# Patient Record
Sex: Female | Born: 1960 | Race: White | Hispanic: No | Marital: Married | Smoking: Never smoker
Health system: Southern US, Community
[De-identification: ages and names within clinical notes are randomized; demographics above are authoritative.]

## PROBLEM LIST (undated history)

## (undated) DIAGNOSIS — Z8632 Personal history of gestational diabetes: Secondary | ICD-10-CM

## (undated) HISTORY — DX: Personal history of gestational diabetes: Z86.32

---

## 1997-09-11 ENCOUNTER — Other Ambulatory Visit: Admission: RE | Admit: 1997-09-11 | Discharge: 1997-09-11 | Payer: Self-pay | Admitting: Obstetrics and Gynecology

## 2000-09-27 ENCOUNTER — Other Ambulatory Visit: Admission: RE | Admit: 2000-09-27 | Discharge: 2000-09-27 | Payer: Self-pay | Admitting: Gynecology

## 2001-08-29 ENCOUNTER — Encounter: Payer: Self-pay | Admitting: Obstetrics and Gynecology

## 2001-08-29 ENCOUNTER — Ambulatory Visit (HOSPITAL_COMMUNITY): Admission: RE | Admit: 2001-08-29 | Discharge: 2001-08-29 | Payer: Self-pay | Admitting: Obstetrics and Gynecology

## 2001-09-27 ENCOUNTER — Other Ambulatory Visit: Admission: RE | Admit: 2001-09-27 | Discharge: 2001-09-27 | Payer: Self-pay | Admitting: Gynecology

## 2002-10-02 ENCOUNTER — Other Ambulatory Visit: Admission: RE | Admit: 2002-10-02 | Discharge: 2002-10-02 | Payer: Self-pay | Admitting: Gynecology

## 2002-10-23 ENCOUNTER — Encounter: Payer: Self-pay | Admitting: Gynecology

## 2002-10-23 ENCOUNTER — Ambulatory Visit (HOSPITAL_COMMUNITY): Admission: RE | Admit: 2002-10-23 | Discharge: 2002-10-23 | Payer: Self-pay | Admitting: Gynecology

## 2003-10-26 ENCOUNTER — Ambulatory Visit (HOSPITAL_COMMUNITY): Admission: RE | Admit: 2003-10-26 | Discharge: 2003-10-26 | Payer: Self-pay | Admitting: Gynecology

## 2004-10-18 ENCOUNTER — Other Ambulatory Visit: Admission: RE | Admit: 2004-10-18 | Discharge: 2004-10-18 | Payer: Self-pay | Admitting: Gynecology

## 2004-10-26 ENCOUNTER — Ambulatory Visit (HOSPITAL_COMMUNITY): Admission: RE | Admit: 2004-10-26 | Discharge: 2004-10-26 | Payer: Self-pay | Admitting: Gynecology

## 2005-10-25 ENCOUNTER — Other Ambulatory Visit: Admission: RE | Admit: 2005-10-25 | Discharge: 2005-10-25 | Payer: Self-pay | Admitting: Gynecology

## 2005-10-27 ENCOUNTER — Ambulatory Visit (HOSPITAL_COMMUNITY): Admission: RE | Admit: 2005-10-27 | Discharge: 2005-10-27 | Payer: Self-pay | Admitting: Gynecology

## 2006-10-29 ENCOUNTER — Ambulatory Visit (HOSPITAL_COMMUNITY): Admission: RE | Admit: 2006-10-29 | Discharge: 2006-10-29 | Payer: Self-pay | Admitting: Gynecology

## 2006-10-29 ENCOUNTER — Other Ambulatory Visit: Admission: RE | Admit: 2006-10-29 | Discharge: 2006-10-29 | Payer: Self-pay | Admitting: Gynecology

## 2006-11-15 ENCOUNTER — Encounter: Admission: RE | Admit: 2006-11-15 | Discharge: 2006-11-15 | Payer: Self-pay | Admitting: Gynecology

## 2007-11-05 ENCOUNTER — Other Ambulatory Visit: Admission: RE | Admit: 2007-11-05 | Discharge: 2007-11-05 | Payer: Self-pay | Admitting: Gynecology

## 2007-11-05 ENCOUNTER — Ambulatory Visit (HOSPITAL_COMMUNITY): Admission: RE | Admit: 2007-11-05 | Discharge: 2007-11-05 | Payer: Self-pay | Admitting: Gynecology

## 2008-11-09 ENCOUNTER — Encounter: Payer: Self-pay | Admitting: Women's Health

## 2008-11-09 ENCOUNTER — Other Ambulatory Visit: Admission: RE | Admit: 2008-11-09 | Discharge: 2008-11-09 | Payer: Self-pay | Admitting: Gynecology

## 2008-11-09 ENCOUNTER — Ambulatory Visit (HOSPITAL_COMMUNITY): Admission: RE | Admit: 2008-11-09 | Discharge: 2008-11-09 | Payer: Self-pay | Admitting: Gynecology

## 2008-11-09 ENCOUNTER — Ambulatory Visit: Payer: Self-pay | Admitting: Women's Health

## 2009-11-10 ENCOUNTER — Other Ambulatory Visit: Admission: RE | Admit: 2009-11-10 | Discharge: 2009-11-10 | Payer: Self-pay | Admitting: Gynecology

## 2009-11-10 ENCOUNTER — Ambulatory Visit (HOSPITAL_COMMUNITY): Admission: RE | Admit: 2009-11-10 | Discharge: 2009-11-10 | Payer: Self-pay | Admitting: Gynecology

## 2009-11-10 ENCOUNTER — Ambulatory Visit: Payer: Self-pay | Admitting: Women's Health

## 2010-10-21 ENCOUNTER — Other Ambulatory Visit: Payer: Self-pay | Admitting: Women's Health

## 2010-10-21 DIAGNOSIS — Z1231 Encounter for screening mammogram for malignant neoplasm of breast: Secondary | ICD-10-CM

## 2010-11-14 ENCOUNTER — Ambulatory Visit (HOSPITAL_COMMUNITY): Payer: Self-pay

## 2010-11-14 ENCOUNTER — Encounter: Payer: Self-pay | Admitting: Women's Health

## 2010-11-18 ENCOUNTER — Ambulatory Visit (INDEPENDENT_AMBULATORY_CARE_PROVIDER_SITE_OTHER): Payer: BC Managed Care – PPO | Admitting: Women's Health

## 2010-11-18 ENCOUNTER — Encounter: Payer: Self-pay | Admitting: Women's Health

## 2010-11-18 ENCOUNTER — Other Ambulatory Visit (HOSPITAL_COMMUNITY)
Admission: RE | Admit: 2010-11-18 | Discharge: 2010-11-18 | Disposition: A | Discharge status: Home / Self Care | Payer: BC Managed Care – PPO | Source: Ambulatory Visit | Attending: Women's Health | Admitting: Women's Health

## 2010-11-18 ENCOUNTER — Ambulatory Visit (HOSPITAL_COMMUNITY)
Admission: RE | Admit: 2010-11-18 | Discharge: 2010-11-18 | Disposition: A | Discharge status: Home / Self Care | Payer: BC Managed Care – PPO | Source: Ambulatory Visit | Attending: Women's Health | Admitting: Women's Health

## 2010-11-18 VITALS — BP 120/76 | Ht 68.5 in | Wt 192.8 lb

## 2010-11-18 DIAGNOSIS — Z1231 Encounter for screening mammogram for malignant neoplasm of breast: Secondary | ICD-10-CM

## 2010-11-18 DIAGNOSIS — Z01419 Encounter for gynecological examination (general) (routine) without abnormal findings: Secondary | ICD-10-CM | POA: Insufficient documentation

## 2010-11-18 DIAGNOSIS — Z833 Family history of diabetes mellitus: Secondary | ICD-10-CM

## 2010-11-18 DIAGNOSIS — Z13220 Encounter for screening for lipoid disorders: Secondary | ICD-10-CM

## 2010-11-18 DIAGNOSIS — Z8632 Personal history of gestational diabetes: Secondary | ICD-10-CM | POA: Insufficient documentation

## 2010-11-18 DIAGNOSIS — R52 Pain, unspecified: Secondary | ICD-10-CM

## 2010-11-18 DIAGNOSIS — Z309 Encounter for contraceptive management, unspecified: Secondary | ICD-10-CM

## 2010-11-18 DIAGNOSIS — IMO0001 Reserved for inherently not codable concepts without codable children: Secondary | ICD-10-CM

## 2010-11-18 DIAGNOSIS — R82998 Other abnormal findings in urine: Secondary | ICD-10-CM

## 2010-11-18 MED ORDER — LEVONORGESTREL-ETHINYL ESTRAD 0.1-20 MG-MCG PO TABS: 1.0000 | ORAL_TABLET | Freq: Every day | ORAL | Status: DC

## 2010-11-18 MED ORDER — IBUPROFEN 600 MG PO TABS: 600.0000 mg | ORAL_TABLET | Freq: Three times a day (TID) | ORAL | Status: AC | PRN

## 2010-11-18 NOTE — Progress Notes (Signed)
Brittany Flowers May 05, 1960 [id number]    History:    The patient presents for annual exam. Teacher assistant for third-grade.    Past medical history, past surgical history, family history and social history were all reviewed and documented in the EPIC chart.   ROS:  A  ROS was performed and pertinent positives and negatives are included in the history.  Exam:  Filed Vitals:   11/18/10 0801  BP: 120/76    General appearance:  Normal Head/Neck:  Normal, without cervical or supraclavicular adenopathy. Thyroid:  Symmetrical, normal in size, without palpable masses or nodularity. Respiratory  Effort:  Normal  Auscultation:  Clear without wheezing or rhonchi Cardiovascular  Auscultation:  Regular rate, without rubs, murmurs or gallops  Edema/varicosities:  Not grossly evident Abdominal   Soft,nontender, without masses, guarding or rebound.  Liver/spleen:  No organomegaly noted  Hernia:  None appreciated  Occult test:   Skin  Inspection:  Grossly normal  Palpation:  Grossly normal Neurologic/psychiatric  Orientation:  Normal with appropriate conversation.  Mood/affect:  Normal  Genitourinary    Breasts: Examined lying and sitting.     Right: Without masses, retractions, discharge or axillary adenopathy.     Left: Without masses, retractions, discharge or axillary adenopathy.   Inguinal/mons:  Normal without inguinal adenopathy  External genitalia:  Normal  BUS/Urethra/Skene's glands:  Normal  Bladder:  Normal  Vagina:  Normal  Cervix:  Normal  Uterus:  anteverted, normal in size, shape and contour.  Midline and mobile  Adnexa/parametria:     Rt: Without masses or tenderness.   Lt: Without masses or tenderness.  Anus and perineum: Normal  Digital rectal exam: Normal sphincter tone without palpated masses or tenderness  Assessment/Plan:  50 y.o. MWF G1P1 for annual exam.   Currently on the Aviane for contraception, light monthly cycle. States her cycles are much  lighter, has an occasional hot flash. Prescription proper use was given did review slight risks of blood clots and strokes. SBEs, yearly mammogram, which have been normal. Reviewed importance of a screening colonoscopy, she will schedule at [facility] were her husband had one done. Encouraged regular exercise, calcium rich diet, cutting calories for weight loss. Encouraged vaginal lubricants for occasional vaginal dryness. CBC, glucose, lipid profile, UA and Pap Also has occasional right elbow pain, states no numbness or tingling, will try Motrin 600 every 8 hours as needed, did review if it would continuetol persist to see an orthopedic for evaluation.   [name] [facility], 8:37 AM 11/18/2010

## 2010-11-21 ENCOUNTER — Telehealth: Payer: Self-pay | Admitting: Women's Health

## 2010-11-21 NOTE — Telephone Encounter (Deleted)
Telephone call to review labs. Reviewed glucose was 108 which is slightly elevated but it was not fasting. H&H was normal 13/40. Lipid profile

## 2010-11-21 NOTE — Telephone Encounter (Signed)
Telephone call to patient to review labs. Reviewed glucose was 108 it was not fasting. H&H normal at 13/40. Lipid profile was elevated, with cholesterol 226 HDL 56 triglycerides 128 LDH 144. Reviewed importance of increasing fiber to greater than 20 g per day, decreasing saturated fat less than 20 g per day, fish oil supplement daily and reviewed importance of exercise at least 30 minutesof the week.

## 2010-11-23 ENCOUNTER — Other Ambulatory Visit: Payer: Self-pay | Admitting: Women's Health

## 2011-09-27 ENCOUNTER — Other Ambulatory Visit: Payer: Self-pay | Admitting: Women's Health

## 2011-09-27 DIAGNOSIS — Z1231 Encounter for screening mammogram for malignant neoplasm of breast: Secondary | ICD-10-CM

## 2011-11-13 ENCOUNTER — Other Ambulatory Visit: Payer: Self-pay | Admitting: Women's Health

## 2011-11-20 ENCOUNTER — Encounter: Payer: BC Managed Care – PPO | Admitting: Women's Health

## 2011-11-23 ENCOUNTER — Encounter: Payer: BC Managed Care – PPO | Admitting: Women's Health

## 2011-11-23 ENCOUNTER — Ambulatory Visit (HOSPITAL_COMMUNITY): Payer: BC Managed Care – PPO

## 2011-12-20 ENCOUNTER — Other Ambulatory Visit: Payer: Self-pay | Admitting: Women's Health

## 2011-12-20 NOTE — Telephone Encounter (Signed)
Has CE scheduled for 02/12/12 with [place].

## 2012-01-16 ENCOUNTER — Other Ambulatory Visit: Payer: Self-pay | Admitting: Women's Health

## 2012-02-12 ENCOUNTER — Ambulatory Visit (HOSPITAL_COMMUNITY)
Admission: RE | Admit: 2012-02-12 | Discharge: 2012-02-12 | Disposition: A | Discharge status: Home / Self Care | Payer: BC Managed Care – PPO | Source: Ambulatory Visit | Attending: Women's Health | Admitting: Women's Health

## 2012-02-12 ENCOUNTER — Ambulatory Visit (INDEPENDENT_AMBULATORY_CARE_PROVIDER_SITE_OTHER): Payer: BC Managed Care – PPO | Admitting: Women's Health

## 2012-02-12 ENCOUNTER — Other Ambulatory Visit: Payer: Self-pay | Admitting: Women's Health

## 2012-02-12 ENCOUNTER — Encounter: Payer: Self-pay | Admitting: Women's Health

## 2012-02-12 VITALS — BP 132/84 | Ht 69.5 in | Wt 185.0 lb

## 2012-02-12 DIAGNOSIS — N951 Menopausal and female climacteric states: Secondary | ICD-10-CM

## 2012-02-12 DIAGNOSIS — Z1231 Encounter for screening mammogram for malignant neoplasm of breast: Secondary | ICD-10-CM | POA: Insufficient documentation

## 2012-02-12 DIAGNOSIS — Z01419 Encounter for gynecological examination (general) (routine) without abnormal findings: Secondary | ICD-10-CM

## 2012-02-12 DIAGNOSIS — E079 Disorder of thyroid, unspecified: Secondary | ICD-10-CM

## 2012-02-12 LAB — TSH: TSH: 0.034 u[IU]/mL — ABNORMAL LOW (ref 0.350–4.500)

## 2012-02-12 MED ORDER — LEVONORGESTREL-ETHINYL ESTRAD 0.1-20 MG-MCG PO TABS: 1.0000 | ORAL_TABLET | Freq: Every day | ORAL | Status: DC

## 2012-02-12 NOTE — Patient Instructions (Addendum)
Schedule colonoscopy Vit D 2000 daily   Health Recommendations for Postmenopausal Women Based on the Results of the Women's Health Initiative [facility]) and Other Studies The WHI is a major 15-year research program to address the most common causes of death, disability and poor quality of life in postmenopausal women. Some of these causes are heart disease, cancer, bone loss (osteoporosis) and others. Taking into account all of the findings from [facility] and other studies, here are bottom-line health recommendations for women: CARDIOVASCULAR DISEASE Heart Disease: A heart attack is a medical emergency. Know the signs and symptoms of a heart attack. Hormone therapy should not be used to prevent heart disease. In women with heart disease, hormone therapy should not be used to prevent further disease. Hormone therapy increases the risk of blood clots. Below are things women can do to reduce their risk for heart disease.   Do not smoke. If you smoke, quit. Women who smoke are 2 to 6 times more likely to suffer a heart attack than non-smoking women.  Aim for a healthy weight. Being overweight causes many preventable deaths. Eat a healthy and balanced diet and drink an adequate amount of liquids.  Get moving. Make a commitment to be more physically active. Aim for 30 minutes of activity on most, if not all days of the week.  Eat for heart health. Choose a diet that is low in saturated fat, trans fat, and cholesterol. Include whole grains, vegetables, and fruits. Read the labels on the food container before buying it.  Know your numbers. Ask your caregiver to check your blood pressure, cholesterol (total, HDL, LDL, triglycerides) and blood glucose. Work with your caregiver to improve any numbers that are not normal.  High blood pressure. Limit or stop your table salt intake (try salt substitute and food seasonings), avoid salty foods and drinks. Read the labels on the food container before buying it. Avoid  becoming overweight by eating well and exercising. STROKE  Stroke is a medical emergency. Stroke can be the result of a blood clot in the blood vessel in the brain or by a brain hemorrhage (bleeding). Know the signs and symptoms of a stroke. To lower the risk of developing a stroke:  Avoid fatty foods.  Quit smoking.  Control your diabetes, blood pressure, and irregular heart rate. THROMBOPHLIBITIS (BLOOD CLOT) OF THE LEG  Hormone treatment is a big cause of developing blood clots in the leg. Becoming overweight and leading a stationary lifestyle also may contribute to developing blood clots. Controlling your diet and exercising will help lower the risk of developing blood clots. CANCER SCREENING  Breast Cancer: Women should take steps to reduce their risk of breast cancer. This includes having regular mammograms, monthly self breast exams and regular breast exams by your caregiver. Have a mammogram every one to two years if you are 84 to 51 years old. Have a mammogram annually if you are 30 years old or older depending on your risk factors. Women who are high risk for breast cancer may need more frequent mammograms. There are tests available (testing the genes in your body) if you have family history of breast cancer called BRCA 1 and 2. These tests can help determine the risks of developing breast cancer.  Intestinal or Stomach Cancer: Women should talk to their caregiver about when to start screening, what tests and how often they should be done, and the benefits and risks of doing these tests. Tests to consider are a rectal exam, fecal occult blood,  sigmoidoscopy, colononoscoby, barium enema and upper GI series of the stomach. Depending on the age, you may want to get a medical and family history of colon cancer. Women who are high risk may need to be screened at an earlier age and more often.  Cervical Cancer: A Pap test of the cervix should be done every year and every 3 years when there has  been three straight years of a normal Pap test. Women with an abnormal Pap test should be screened more often or have a cervical biopsy depending on your caregiver's recommendation.  Uterine Cancer: If you have vaginal bleeding after you are in the menopause, it should be evaluated by your caregiver.  Ovarian cancer: There are no reliable tests available to screen for ovarian cancer at this time except for yearly pelvic exams.  Lung Cancer: Yearly chest X-rays can detect lung cancer and should be done on high risk women, such as cigarette smokers and women with chronic lung disease (emphysemia).  Skin Cancer: A complete body skin exam should be done at your yearly examination. Avoid overexposure to the sun and ultraviolet light lamps. Use a strong sun block cream when in the sun. All of these things are important in lowering the risk of skin cancer. MENOPAUSE Menopause Symptoms: Hormone therapy products are effective for treating symptoms associated with menopause:  Moderate to severe hot flashes.  Night sweats.  Mood swings.  Headaches.  Tiredness.  Loss of sex drive.  Insomnia.  Other symptoms. However, hormone therapy products carry serious risks, especially in older women. Women who use or are thinking about using estrogen or estrogen with progestin treatments should discuss that with their caregiver. Your caregiver will know if the benefits outweigh the risks. The Food and Drug Administration (FDA) has concluded that hormone therapy should be used only at the lowest doses and for the shortest amount of time to reach treatment goals. It is not known at what doses there may be less risk of serious side effects. There are other treatments such as herbal medication (not controlled or regulated by the FDA), group therapy, counseling and acupuncture that may be helpful. OSTEOPOROSIS Protecting Against Bone Loss and Preventing Fracture: If hormone therapy is used for prevention of bone  loss (osteoporosis), the risks for bone loss must outweigh the risk of the therapy. Women considering taking hormone therapy for bone loss should ask their health care providers about other medications (fosamax and boniva) that are considered safe and effective for preventing bone loss and bone fractures. To guard against bone loss or fractures, it is recommended that women should take at least 1000-1500 mg of calcium and 400-800 IU of vitamin D daily in divided doses. Smoking and excessive alcohol intake increases the risk of osteoporosis. Eat foods rich in calcium and vitamin D and do weight bearing exercises several times a week as your caregiver suggests. DIABETES Diabetes Melitus: Women with Type I or Type 2 diabetes should keep their diabetes in control with diet, exercise and medication. Avoid too many sweets, starchy and fatty foods. Being overweight can affect your diabetes. COGNITION AND MEMORY Cognition and Memory: Menopausal hormone therapy is not recommended for the prevention of cognitive disorders such as Alzheimer's disease or memory loss. WHI found that women treated with hormone therapy have a greater risk of developing dementia.  DEPRESSION  Depression may occur at any age, but is common in elderly women. The reasons may be because of physical, medical, social (loneliness), financial and/or economic problems and needs.  Becoming involved with church, volunteer or social groups, seeking treatment for any physical or medical problems is recommended. Also, look into getting professional advice for any economic or financial problems. ACCIDENTS  Accidents are common and can be serious in the elderly woman. Prepare your house to prevent accidents. Eliminate throw rugs, use hip protectors, place hand bars in the bath, shower and toilet areas. Avoid wearing high heel shoes and walking on wet, snowy and icy areas. Stop driving if you have vision, hearing problems or are unsteady with you movements  and reflexes. RHEUMATOID ARTHRITIS Rheumatoid arthritis causes pain, swelling and stiffness of your bone joints. It can limit many of your activities. Over-the-counter medications may help, but prescription medications may be necessary. Talk with your caregiver about this. Exercise (walking, water aerobics), good posture, using splints on painful joints, warm baths or applying warm compresses to stiff joints and cold compresses to painful joints may be helpful. Smoking and excessive drinking may worsen the symptoms of arthritis. Seek help from a physical therapist if the arthritis is becoming a problem with your daily activities. IMMUNIZATIONS  Several immunizations are important to have during your senior years, including:   Tetanus and a diptheria shot booster every 10 years.  Influenza every year before the flu season begins.  Pneumonia vaccine.  Shingles vaccine.  Others as indicated (example: H1N1 vaccine). Document Released: 05/12/2005 Document Revised: 06/12/2011 Document Reviewed: 01/06/2008 [facility] Patient Information 2013 [place], [place].

## 2012-02-12 NOTE — Progress Notes (Addendum)
Brittany Flowers 1960/05/15 [id number]    History:    The patient presents for annual exam. Light monthly cycle on Aviane with occasional hot flushes and vaginal dryness. History of normal Paps and mammograms. Has not had a colonoscopy. Has not been feeling well, saw primary care 2 weeks ago, had a normal CBC, glucose, lipid panel, but states TSH was elevated and was to have repeated.  Past medical history, past surgical history, family history and social history were all reviewed and documented in the EPIC chart. Kristen 17 at [employer] and doing well. Second-grade Geologist, engineering. History of GDM, father diabetic.  ROS:  A  ROS was performed and pertinent positives and negatives are included in the history.  Exam:  Filed Vitals:   02/12/12 0831  BP: 132/84    General appearance:  Normal Head/Neck:  Normal, without cervical or supraclavicular adenopathy. Thyroid:  Symmetrical, normal in size, without palpable masses or nodularity. Respiratory  Effort:  Normal  Auscultation:  Clear without wheezing or rhonchi Cardiovascular  Auscultation:  Regular rate, without rubs, murmurs or gallops  Edema/varicosities:  Not grossly evident Abdominal  Soft,nontender, without masses, guarding or rebound.  Liver/spleen:  No organomegaly noted  Hernia:  None appreciated  Skin  Inspection:  Grossly normal  Palpation:  Grossly normal Neurologic/psychiatric  Orientation:  Normal with appropriate conversation.  Mood/affect:  Normal  Genitourinary    Breasts: Examined lying and sitting.     Right: Without masses, retractions, discharge or axillary adenopathy.     Left: Without masses, retractions, discharge or axillary adenopathy.   Inguinal/mons:  Normal without inguinal adenopathy  External genitalia:  Normal  BUS/Urethra/Skene's glands:  Normal  Bladder:  Normal  Vagina:  Vaginal dryness   Cervix:  Normal  Uterus:   normal in size, shape and contour.  Midline and mobile  Adnexa/parametria:      Rt: Without masses or tenderness.   Lt: Without masses or tenderness.  Anus and perineum: Normal  Digital rectal exam: Normal sphincter tone without palpated masses or tenderness  Assessment/Plan:  51 y.o. M. WF G1 P1 for annual exam.     Normal peri- menopausal exam on Aviane  Plan: Aviane prescription, proper use, reviewed slight risk for blood clots and strokes. TSH, FSH, UA. Normal Pap 2012 reviewed new screening guidelines. Reviewed if [facility] is elevated, currently on placebo week will change to Femhrt. SBE's, continue annual mammogram, calcium rich diet, vitamin D 2000 daily encouraged and continue lubricants with intercourse for dryness.. Reviewed importance of decreasing calories and increasing exercise for weight loss and health. Instructed to schedule colonoscopy, husband has had one will schedule there. Instructed to call if problems securing appointment.      [name] [facility], 11:52 AM 02/12/2012

## 2012-02-13 LAB — URINALYSIS W MICROSCOPIC + REFLEX CULTURE
Crystals: NONE SEEN
Glucose, UA: NEGATIVE mg/dL
Hgb urine dipstick: NEGATIVE
Specific Gravity, Urine: 1.013 (ref 1.005–1.030)
Urobilinogen, UA: 0.2 mg/dL (ref 0.0–1.0)
pH: 6 (ref 5.0–8.0)

## 2012-02-14 LAB — THYROID PROFILE - CHCC: T4, Total: 11.8 ug/dL (ref 5.0–12.5)

## 2012-02-14 LAB — URINE CULTURE: Organism ID, Bacteria: NO GROWTH

## 2012-03-10 ENCOUNTER — Other Ambulatory Visit: Payer: Self-pay | Admitting: Women's Health

## 2012-09-03 ENCOUNTER — Telehealth: Payer: Self-pay | Admitting: *Deleted

## 2012-09-06 ENCOUNTER — Encounter: Payer: Self-pay | Admitting: Gastroenterology

## 2012-09-06 ENCOUNTER — Telehealth: Payer: Self-pay

## 2012-09-06 NOTE — Telephone Encounter (Signed)
Patient wants screening colonoscopy scheduled per [place] recommendation for week of July 7. She would like it with [facility]. I called and scheduled her for Thursday, July 10 with Dr. Melvia Heaps at [facility].  She will check in 10:30 for 11:30 appt.  Her nurse visit was scheduled for June 26 4:00pm.  Per patient request I called and gave all this info to her husband.

## 2012-09-19 ENCOUNTER — Ambulatory Visit (AMBULATORY_SURGERY_CENTER): Payer: BC Managed Care – PPO | Admitting: *Deleted

## 2012-09-19 VITALS — Ht 68.0 in | Wt 189.2 lb

## 2012-09-19 DIAGNOSIS — Z1211 Encounter for screening for malignant neoplasm of colon: Secondary | ICD-10-CM

## 2012-09-19 MED ORDER — NA SULFATE-K SULFATE-MG SULF 17.5-3.13-1.6 GM/177ML PO SOLN: 1.0000 | Freq: Once | ORAL | Status: DC

## 2012-09-19 NOTE — Progress Notes (Signed)
Denies allergies to eggs or soy products. Denies complications with sedation or anesthesia. 

## 2012-09-20 ENCOUNTER — Encounter: Payer: Self-pay | Admitting: Gastroenterology

## 2012-10-10 ENCOUNTER — Ambulatory Visit (AMBULATORY_SURGERY_CENTER): Payer: BC Managed Care – PPO | Admitting: Gastroenterology

## 2012-10-10 ENCOUNTER — Encounter: Payer: Self-pay | Admitting: Gastroenterology

## 2012-10-10 VITALS — BP 127/72 | HR 52 | Temp 97.0°F | Resp 15 | Ht 68.0 in | Wt 189.0 lb

## 2012-10-10 DIAGNOSIS — Z1211 Encounter for screening for malignant neoplasm of colon: Secondary | ICD-10-CM

## 2012-10-10 MED ORDER — SODIUM CHLORIDE 0.9 % IV SOLN: 500.0000 mL | INTRAVENOUS | Status: DC

## 2012-10-10 NOTE — Progress Notes (Signed)
Patient did not experience any of the following events: a burn prior to discharge; a fall within the facility; wrong site/side/patient/procedure/implant event; or a hospital transfer or hospital admission upon discharge from the facility. (G8907) Patient did not have preoperative order for IV antibiotic SSI prophylaxis. (G8918)  

## 2012-10-10 NOTE — Progress Notes (Signed)
Stable to RR 

## 2012-10-10 NOTE — Op Note (Signed)
[facility] [address] [place], [address]   COLONOSCOPY PROCEDURE REPORT  PATIENT: Brittany Flowers, Brittany Flowers  MR#: [id number] BIRTHDATE: 1960/08/24 , 51  yrs. old GENDER: Female ENDOSCOPIST: Louis Meckel, MD REFERRED GN:FAOZHY Christell Constant, M.D.  Reynaldo Minium, M.D. PROCEDURE DATE:  10/10/2012 PROCEDURE:   Colonoscopy, diagnostic ASA CLASS:   Class I INDICATIONS:average risk screening. MEDICATIONS: propofol (Diprivan) 200mg  IV  DESCRIPTION OF PROCEDURE:   After the risks benefits and alternatives of the procedure were thoroughly explained, informed consent was obtained.  A digital rectal exam revealed no abnormalities of the rectum.   The LB [id number]  endoscope was introduced through the anus and advanced to the cecum, which was identified by both the appendix and ileocecal valve. No adverse events experienced.   The quality of the prep was excellent using Suprep  The instrument was then slowly withdrawn as the colon was fully examined.      COLON FINDINGS: A normal appearing cecum, ileocecal valve, and appendiceal orifice were identified.  The ascending, hepatic flexure, transverse, splenic flexure, descending, sigmoid colon and rectum appeared unremarkable.  No polyps or cancers were seen. Retroflexed views revealed no abnormalities. The time to cecum=6 minutes 33 seconds.  Withdrawal time=6 minutes 18 seconds.  The scope was withdrawn and the procedure completed. COMPLICATIONS: There were no complications.  ENDOSCOPIC IMPRESSION: Normal colon  RECOMMENDATIONS: Continue current colorectal screening recommendations for "routine risk" patients with a repeat colonoscopy in 10 years.   eSigned:  Louis Meckel, MD 10/10/2012 11:50 AM   cc:

## 2012-10-10 NOTE — Progress Notes (Signed)
No egg or soy products allergy. ewm 

## 2012-10-10 NOTE — Patient Instructions (Addendum)
Discharge instructions given with verbal understanding. Normal exam. Resume previous medications. YOU HAD AN ENDOSCOPIC PROCEDURE TODAY AT THE [facility]: Refer to the procedure report that was given to you for any specific questions about what was found during the examination.  If the procedure report does not answer your questions, please call your gastroenterologist to clarify.  If you requested that your care partner not be given the details of your procedure findings, then the procedure report has been included in a sealed envelope for you to review at your convenience later.  YOU SHOULD EXPECT: Some feelings of bloating in the abdomen. Passage of more gas than usual.  Walking can help get rid of the air that was put into your GI tract during the procedure and reduce the bloating. If you had a lower endoscopy (such as a colonoscopy or flexible sigmoidoscopy) you may notice spotting of blood in your stool or on the toilet paper. If you underwent a bowel prep for your procedure, then you may not have a normal bowel movement for a few days.  DIET: Your first meal following the procedure should be a light meal and then it is ok to progress to your normal diet.  A half-sandwich or bowl of soup is an example of a good first meal.  Heavy or fried foods are harder to digest and may make you feel nauseous or bloated.  Likewise meals heavy in dairy and vegetables can cause extra gas to form and this can also increase the bloating.  Drink plenty of fluids but you should avoid alcoholic beverages for 24 hours.  ACTIVITY: Your care partner should take you home directly after the procedure.  You should plan to take it easy, moving slowly for the rest of the day.  You can resume normal activity the day after the procedure however you should NOT DRIVE or use heavy machinery for 24 hours (because of the sedation medicines used during the test).    SYMPTOMS TO REPORT IMMEDIATELY: A gastroenterologist  can be reached at any hour.  During normal business hours, 8:30 AM to 5:00 PM Monday through Friday, call [phone].  After hours and on weekends, please call the GI answering service at [phone] who will take a message and have the physician on call contact you.   Following lower endoscopy (colonoscopy or flexible sigmoidoscopy):  Excessive amounts of blood in the stool  Significant tenderness or worsening of abdominal pains  Swelling of the abdomen that is new, acute  Fever of 100F or higher  FOLLOW UP: If any biopsies were taken you will be contacted by phone or by letter within the next 1-3 weeks.  Call your gastroenterologist if you have not heard about the biopsies in 3 weeks.  Our staff will call the home number listed on your records the next business day following your procedure to check on you and address any questions or concerns that you may have at that time regarding the information given to you following your procedure. This is a courtesy call and so if there is no answer at the home number and we have not heard from you through the emergency physician on call, we will assume that you have returned to your regular daily activities without incident.  SIGNATURES/CONFIDENTIALITY: You and/or your care partner have signed paperwork which will be entered into your electronic medical record.  These signatures attest to the fact that that the information above on your After Visit Summary has been reviewed   and is understood.  Full responsibility of the confidentiality of this discharge information lies with you and/or your care-partner. 

## 2012-10-11 ENCOUNTER — Telehealth: Payer: Self-pay | Admitting: *Deleted

## 2012-10-11 NOTE — Telephone Encounter (Signed)
No answer, message left for the patient. 

## 2012-10-30 NOTE — Telephone Encounter (Signed)
x

## 2013-01-08 ENCOUNTER — Other Ambulatory Visit: Payer: Self-pay | Admitting: Women's Health

## 2013-01-08 DIAGNOSIS — Z1231 Encounter for screening mammogram for malignant neoplasm of breast: Secondary | ICD-10-CM

## 2013-02-25 ENCOUNTER — Encounter: Payer: Self-pay | Admitting: Women's Health

## 2013-02-25 ENCOUNTER — Ambulatory Visit (HOSPITAL_COMMUNITY): Payer: BC Managed Care – PPO

## 2013-03-31 ENCOUNTER — Ambulatory Visit (HOSPITAL_COMMUNITY)
Admission: RE | Admit: 2013-03-31 | Discharge: 2013-03-31 | Disposition: A | Discharge status: Home / Self Care | Payer: BC Managed Care – PPO | Source: Ambulatory Visit | Attending: Women's Health | Admitting: Women's Health

## 2013-03-31 ENCOUNTER — Encounter: Payer: Self-pay | Admitting: Women's Health

## 2013-03-31 ENCOUNTER — Other Ambulatory Visit (HOSPITAL_COMMUNITY)
Admission: RE | Admit: 2013-03-31 | Discharge: 2013-03-31 | Disposition: A | Discharge status: Home / Self Care | Payer: BC Managed Care – PPO | Source: Ambulatory Visit | Attending: Gynecology | Admitting: Gynecology

## 2013-03-31 ENCOUNTER — Ambulatory Visit (INDEPENDENT_AMBULATORY_CARE_PROVIDER_SITE_OTHER): Payer: BC Managed Care – PPO | Admitting: Women's Health

## 2013-03-31 VITALS — BP 116/76 | Ht 68.5 in | Wt 197.2 lb

## 2013-03-31 DIAGNOSIS — Z01419 Encounter for gynecological examination (general) (routine) without abnormal findings: Secondary | ICD-10-CM

## 2013-03-31 DIAGNOSIS — N951 Menopausal and female climacteric states: Secondary | ICD-10-CM

## 2013-03-31 DIAGNOSIS — IMO0001 Reserved for inherently not codable concepts without codable children: Secondary | ICD-10-CM

## 2013-03-31 DIAGNOSIS — Z833 Family history of diabetes mellitus: Secondary | ICD-10-CM

## 2013-03-31 DIAGNOSIS — Z13220 Encounter for screening for lipoid disorders: Secondary | ICD-10-CM

## 2013-03-31 DIAGNOSIS — E0590 Thyrotoxicosis, unspecified without thyrotoxic crisis or storm: Secondary | ICD-10-CM | POA: Insufficient documentation

## 2013-03-31 DIAGNOSIS — Z1231 Encounter for screening mammogram for malignant neoplasm of breast: Secondary | ICD-10-CM

## 2013-03-31 DIAGNOSIS — Z309 Encounter for contraceptive management, unspecified: Secondary | ICD-10-CM

## 2013-03-31 LAB — CBC WITH DIFFERENTIAL/PLATELET
Basophils Absolute: 0 10*3/uL (ref 0.0–0.1)
Eosinophils Absolute: 0.1 10*3/uL (ref 0.0–0.7)
Lymphs Abs: 2.5 10*3/uL (ref 0.7–4.0)
MCH: 32.4 pg (ref 26.0–34.0)
Neutrophils Relative %: 58 % (ref 43–77)
Platelets: 226 10*3/uL (ref 150–400)
RBC: 4.51 MIL/uL (ref 3.87–5.11)
WBC: 8.1 10*3/uL (ref 4.0–10.5)

## 2013-03-31 LAB — GLUCOSE, RANDOM: Glucose, Bld: 95 mg/dL (ref 70–99)

## 2013-03-31 LAB — LIPID PANEL: HDL: 48 mg/dL (ref >39)

## 2013-03-31 MED ORDER — LEVONORGESTREL-ETHINYL ESTRAD 0.1-20 MG-MCG PO TABS: 1.0000 | ORAL_TABLET | Freq: Every day | ORAL | Status: DC

## 2013-03-31 NOTE — Patient Instructions (Signed)
Health Recommendations for Postmenopausal Women Respected and ongoing research has looked at the most common causes of death, disability, and poor quality of life in postmenopausal women. The causes include heart disease, diseases of blood vessels, diabetes, depression, cancer, and bone loss (osteoporosis). Many things can be done to help lower the chances of developing these and other common problems: CARDIOVASCULAR DISEASE Heart Disease: A heart attack is a medical emergency. Know the signs and symptoms of a heart attack. Below are things women can do to reduce their risk for heart disease.   Do not smoke. If you smoke, quit.  Aim for a healthy weight. Being overweight causes many preventable deaths. Eat a healthy and balanced diet and drink an adequate amount of liquids.  Get moving. Make a commitment to be more physically active. Aim for 30 minutes of activity on most, if not all days of the week.  Eat for heart health. Choose a diet that is low in saturated fat and cholesterol and eliminate trans fat. Include whole grains, vegetables, and fruits. Read and understand the labels on food containers before buying.  Know your numbers. Ask your caregiver to check your blood pressure, cholesterol (total, HDL, LDL, triglycerides) and blood glucose. Work with your caregiver on improving your entire clinical picture.  High blood pressure. Limit or stop your table salt intake (try salt substitute and food seasonings). Avoid salty foods and drinks. Read labels on food containers before buying. Eating well and exercising can help control high blood pressure. STROKE  Stroke is a medical emergency. Stroke may be the result of a blood clot in a blood vessel in the brain or by a brain hemorrhage (bleeding). Know the signs and symptoms of a stroke. To lower the risk of developing a stroke:  Avoid fatty foods.  Quit smoking.  Control your diabetes, blood pressure, and irregular heart rate. THROMBOPHLEBITIS  (BLOOD CLOT) OF THE LEG  Becoming overweight and leading a stationary lifestyle may also contribute to developing blood clots. Controlling your diet and exercising will help lower the risk of developing blood clots. CANCER SCREENING  Breast Cancer: Take steps to reduce your risk of breast cancer.  You should practice "breast self-awareness." This means understanding the normal appearance and feel of your breasts and should include breast self-examination. Any changes detected, no matter how small, should be reported to your caregiver.  After age 40, you should have a clinical breast exam (CBE) every year.  Starting at age 40, you should consider having a mammogram (breast X-ray) every year.  If you have a family history of breast cancer, talk to your caregiver about genetic screening.  If you are at high risk for breast cancer, talk to your caregiver about having an MRI and a mammogram every year.  Intestinal or Stomach Cancer: Tests to consider are a rectal exam, fecal occult blood, sigmoidoscopy, and colonoscopy. Women who are high risk may need to be screened at an earlier age and more often.  Cervical Cancer:  Beginning at age 30, you should have a Pap test every 3 years as long as the past 3 Pap tests have been normal.  If you have had past treatment for cervical cancer or a condition that could lead to cancer, you need Pap tests and screening for cancer for at least 20 years after your treatment.  If you had a hysterectomy for a problem that was not cancer or a condition that could lead to cancer, then you no longer need Pap tests.    If you are between ages 65 and 70, and you have had normal Pap tests going back 10 years, you no longer need Pap tests.  If Pap tests have been discontinued, risk factors (such as a new sexual partner) need to be reassessed to determine if screening should be resumed.  Some medical problems can increase the chance of getting cervical cancer. In these  cases, your caregiver may recommend more frequent screening and Pap tests.  Uterine Cancer: If you have vaginal bleeding after reaching menopause, you should notify your caregiver.  Ovarian cancer: Other than yearly pelvic exams, there are no reliable tests available to screen for ovarian cancer at this time except for yearly pelvic exams.  Lung Cancer: Yearly chest X-rays can detect lung cancer and should be done on high risk women, such as cigarette smokers and women with chronic lung disease (emphysema).  Skin Cancer: A complete body skin exam should be done at your yearly examination. Avoid overexposure to the sun and ultraviolet light lamps. Use a strong sun block cream when in the sun. All of these things are important in lowering the risk of skin cancer. MENOPAUSE Menopause Symptoms: Hormone therapy products are effective for treating symptoms associated with menopause:  Moderate to severe hot flashes.  Night sweats.  Mood swings.  Headaches.  Tiredness.  Loss of sex drive.  Insomnia.  Other symptoms. Hormone replacement carries certain risks, especially in older women. Women who use or are thinking about using estrogen or estrogen with progestin treatments should discuss that with their caregiver. Your caregiver will help you understand the benefits and risks. The ideal dose of hormone replacement therapy is not known. The Food and Drug Administration (FDA) has concluded that hormone therapy should be used only at the lowest doses and for the shortest amount of time to reach treatment goals.  OSTEOPOROSIS Protecting Against Bone Loss and Preventing Fracture: If you use hormone therapy for prevention of bone loss (osteoporosis), the risks for bone loss must outweigh the risk of the therapy. Ask your caregiver about other medications known to be safe and effective for preventing bone loss and fractures. To guard against bone loss or fractures, the following is recommended:  If  you are less than age 50, take 1000 mg of calcium and at least 600 mg of Vitamin D per day.  If you are greater than age 50 but less than age 70, take 1200 mg of calcium and at least 600 mg of Vitamin D per day.  If you are greater than age 70, take 1200 mg of calcium and at least 800 mg of Vitamin D per day. Smoking and excessive alcohol intake increases the risk of osteoporosis. Eat foods rich in calcium and vitamin D and do weight bearing exercises several times a week as your caregiver suggests. DIABETES Diabetes Melitus: If you have Type I or Type 2 diabetes, you should keep your blood sugar under control with diet, exercise and recommended medication. Avoid too many sweets, starchy and fatty foods. Being overweight can make control more difficult. COGNITION AND MEMORY Cognition and Memory: Menopausal hormone therapy is not recommended for the prevention of cognitive disorders such as Alzheimer's disease or memory loss.  DEPRESSION  Depression may occur at any age, but is common in elderly women. The reasons may be because of physical, medical, social (loneliness), or financial problems and needs. If you are experiencing depression because of medical problems and control of symptoms, talk to your caregiver about this. Physical activity and   exercise may help with mood and sleep. Community and volunteer involvement may help your sense of value and worth. If you have depression and you feel that the problem is getting worse or becoming severe, talk to your caregiver about treatment options that are best for you. ACCIDENTS  Accidents are common and can be serious in the elderly woman. Prepare your house to prevent accidents. Eliminate throw rugs, place hand bars in the bath, shower and toilet areas. Avoid wearing high heeled shoes or walking on wet, snowy, and icy areas. Limit or stop driving if you have vision or hearing problems, or you feel you are unsteady with you movements and  reflexes. HEPATITIS C Hepatitis C is a type of viral infection affecting the liver. It is spread mainly through contact with blood from an infected person. It can be treated, but if left untreated, it can lead to severe liver damage over years. Many people who are infected do not know that the virus is in their blood. If you are a "baby-boomer", it is recommended that you have one screening test for Hepatitis C. IMMUNIZATIONS  Several immunizations are important to consider having during your senior years, including:   Tetanus, diptheria, and pertussis booster shot.  Influenza every year before the flu season begins.  Pneumonia vaccine.  Shingles vaccine.  Others as indicated based on your specific needs. Talk to your caregiver about these. Document Released: 05/12/2005 Document Revised: 03/06/2012 Document Reviewed: 01/06/2008 [employer] Patient Information 2014 [employer].  

## 2013-03-31 NOTE — Addendum Note (Signed)
Addended by: Bertram Savin A on: 03/31/2013 09:38 AM   Modules accepted: Orders

## 2013-03-31 NOTE — Progress Notes (Signed)
Brittany Flowers Oct 07, 1960 [id number]    History:    The patient presents for annual exam.  Light monthly cycle on on aviane with occasional night flushes and decreased libido. Normal Pap and mammogram history. Negative colonoscopy 10/2012. Hyperthyroidism managed by endocrinologist , thyroid panel normal no medication.   Past medical history, past surgical history, family history and social history were all reviewed and documented in the EPIC chart. [employer]ourse teacher in elementary school. GDM.  Brittany Flowers, 18 at [facility] doing well. Father diabetes and heart disease. Mother died of pancreatic cancer at 17.   ROS:  A  ROS was performed and pertinent positives and negatives are included in the history.  Exam:  Filed Vitals:   03/31/13 0807  BP: 116/76    General appearance:  Normal Head/Neck:  Normal, without cervical or supraclavicular adenopathy. Thyroid:  Symmetrical, normal in size, without palpable masses or nodularity. Respiratory  Effort:  Normal  Auscultation:  Clear without wheezing or rhonchi Cardiovascular  Auscultation:  Regular rate, without rubs, murmurs or gallops  Edema/varicosities:  Not grossly evident Abdominal  Soft,nontender, without masses, guarding or rebound.  Liver/spleen:  No organomegaly noted  Hernia:  None appreciated  Skin  Inspection:  Grossly normal  Palpation:  Grossly normal Neurologic/psychiatric  Orientation:  Normal with appropriate conversation.  Mood/affect:  Normal  Genitourinary    Breasts: Examined lying and sitting.     Right: Without masses, retractions, discharge or axillary adenopathy.     Left: Without masses, retractions, discharge or axillary adenopathy.   Inguinal/mons:  Normal without inguinal adenopathy  External genitalia:  Normal  BUS/Urethra/Skene's glands:  Normal  Bladder:  Normal  Vagina:  Normal  Cervix:  Normal  Uterus:   normal in size, shape and contour.  Midline and mobile  Adnexa/parametria:     Rt: Without masses  or tenderness.   Lt: Without masses or tenderness.  Anus and perineum: Normal  Digital rectal exam: Normal sphincter tone without palpated masses or tenderness  Assessment/Plan:  52 y.o. MWF G1P1 for annual exam.     Perimenopausal Hyperthyroid managed endocrinologist/no med Overweight  Plan: SBE's, continue annual mammogram, 3-D tomography reviewed and encouraged history of dense breast. Reviewed importance of increasing regular exercise and decreasing calories for weight loss. CBC, glucose, lipid panel, FSH, UA, Pap. Pap normal 2012, new screening guidelines reviewed.  Aviane prescription with slight risk for blood clots and strokes reviewed. Will triage based on [facility] results.   [name] [facility], 8:34 AM 03/31/2013

## 2013-04-01 LAB — URINALYSIS W MICROSCOPIC + REFLEX CULTURE
Casts: NONE SEEN
Crystals: NONE SEEN
Glucose, UA: NEGATIVE mg/dL
Hgb urine dipstick: NEGATIVE
Specific Gravity, Urine: 1.007 (ref 1.005–1.030)
Squamous Epithelial / LPF: NONE SEEN
pH: 6 (ref 5.0–8.0)

## 2013-04-02 LAB — URINE CULTURE: Organism ID, Bacteria: NO GROWTH

## 2014-01-07 ENCOUNTER — Ambulatory Visit: Payer: BC Managed Care – PPO

## 2014-01-09 ENCOUNTER — Other Ambulatory Visit: Payer: Self-pay | Admitting: Women's Health

## 2014-01-09 DIAGNOSIS — Z1231 Encounter for screening mammogram for malignant neoplasm of breast: Secondary | ICD-10-CM

## 2014-02-02 ENCOUNTER — Encounter: Payer: Self-pay | Admitting: Women's Health

## 2014-03-30 ENCOUNTER — Other Ambulatory Visit: Payer: Self-pay

## 2014-03-30 MED ORDER — LEVONORGESTREL-ETHINYL ESTRAD 0.1-20 MG-MCG PO TABS: 1.0000 | ORAL_TABLET | Freq: Every day | ORAL | Status: DC

## 2014-03-31 ENCOUNTER — Other Ambulatory Visit: Payer: Self-pay

## 2014-03-31 MED ORDER — LEVONORGESTREL-ETHINYL ESTRAD 0.1-20 MG-MCG PO TABS: 1.0000 | ORAL_TABLET | Freq: Every day | ORAL | Status: DC

## 2014-04-01 ENCOUNTER — Other Ambulatory Visit: Payer: Self-pay | Admitting: Women's Health

## 2014-04-01 ENCOUNTER — Encounter: Payer: Self-pay | Admitting: Women's Health

## 2014-04-01 ENCOUNTER — Ambulatory Visit (HOSPITAL_COMMUNITY)
Admission: RE | Admit: 2014-04-01 | Discharge: 2014-04-01 | Disposition: A | Discharge status: Home / Self Care | Payer: BC Managed Care – PPO | Source: Ambulatory Visit | Attending: Women's Health | Admitting: Women's Health

## 2014-04-01 ENCOUNTER — Ambulatory Visit (INDEPENDENT_AMBULATORY_CARE_PROVIDER_SITE_OTHER): Payer: BC Managed Care – PPO | Admitting: Women's Health

## 2014-04-01 VITALS — BP 119/78 | Ht 69.0 in | Wt 207.2 lb

## 2014-04-01 DIAGNOSIS — Z13220 Encounter for screening for lipoid disorders: Secondary | ICD-10-CM

## 2014-04-01 DIAGNOSIS — Z3041 Encounter for surveillance of contraceptive pills: Secondary | ICD-10-CM

## 2014-04-01 DIAGNOSIS — Z1231 Encounter for screening mammogram for malignant neoplasm of breast: Secondary | ICD-10-CM

## 2014-04-01 DIAGNOSIS — Z01419 Encounter for gynecological examination (general) (routine) without abnormal findings: Secondary | ICD-10-CM

## 2014-04-01 LAB — CBC WITH DIFFERENTIAL/PLATELET
Basophils Absolute: 0 10*3/uL (ref 0.0–0.1)
Basophils Relative: 0 % (ref 0–1)
Eosinophils Absolute: 0.1 10*3/uL (ref 0.0–0.7)
Eosinophils Relative: 1 % (ref 0–5)
HCT: 42.9 % (ref 36.0–46.0)
Hemoglobin: 14.9 g/dL (ref 12.0–15.0)
LYMPHS ABS: 2.2 10*3/uL (ref 0.7–4.0)
Lymphocytes Relative: 26 % (ref 12–46)
MCH: 31.8 pg (ref 26.0–34.0)
MCHC: 34.7 g/dL (ref 30.0–36.0)
MCV: 91.7 fL (ref 78.0–100.0)
MPV: 9.7 fL (ref 8.6–12.4)
Monocytes Absolute: 0.6 10*3/uL (ref 0.1–1.0)
Monocytes Relative: 7 % (ref 3–12)
NEUTROS PCT: 66 % (ref 43–77)
Neutro Abs: 5.5 10*3/uL (ref 1.7–7.7)
Platelets: 253 10*3/uL (ref 150–400)
RBC: 4.68 MIL/uL (ref 3.87–5.11)
RDW: 12.7 % (ref 11.5–15.5)
WBC: 8.4 10*3/uL (ref 4.0–10.5)

## 2014-04-01 LAB — COMPREHENSIVE METABOLIC PANEL
ALT: 10 U/L (ref 0–35)
AST: 14 U/L (ref 0–37)
Albumin: 4.1 g/dL (ref 3.5–5.2)
Alkaline Phosphatase: 89 U/L (ref 39–117)
BILIRUBIN TOTAL: 0.5 mg/dL (ref 0.2–1.2)
BUN: 12 mg/dL (ref 6–23)
CHLORIDE: 103 meq/L (ref 96–112)
CO2: 26 meq/L (ref 19–32)
CREATININE: 1.19 mg/dL — AB (ref 0.50–1.10)
Calcium: 9.3 mg/dL (ref 8.4–10.5)
Glucose, Bld: 99 mg/dL (ref 70–99)
Potassium: 5.3 mEq/L (ref 3.5–5.3)
SODIUM: 138 meq/L (ref 135–145)
Total Protein: 7.1 g/dL (ref 6.0–8.3)

## 2014-04-01 LAB — LIPID PANEL
CHOL/HDL RATIO: 5.2 ratio
Cholesterol: 254 mg/dL — ABNORMAL HIGH (ref 0–200)
HDL: 49 mg/dL (ref >39)
LDL CALC: 169 mg/dL — AB (ref 0–99)
TRIGLYCERIDES: 180 mg/dL — AB (ref <150)
VLDL: 36 mg/dL (ref 0–40)

## 2014-04-01 LAB — FOLLICLE STIMULATING HORMONE: FSH: 1.2 m[IU]/mL

## 2014-04-01 LAB — TSH: TSH: 3.543 u[IU]/mL (ref 0.350–4.500)

## 2014-04-01 MED ORDER — LEVONORGESTREL-ETHINYL ESTRAD 0.1-20 MG-MCG PO TABS: 1.0000 | ORAL_TABLET | Freq: Every day | ORAL | Status: DC

## 2014-04-01 NOTE — Progress Notes (Signed)
Sherlon Handingdna K Doi 09/29/1960 [id number]    History:    Presents for annual exam.  Light 2-3 day menstrual cycle on off beyond. Increased hot flushes this past year. Sisters and mother went through menopause in late 7050s. Normal Pap and mammogram history. Hypothyroid managed by endocrinologist. History of GDM. Elevated lipid panel 2014. Negative colonoscopy 2014.  Past medical history, past surgical history, family history and social history were all reviewed and documented in the EPIC chart. 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 [facility]in 19 [facility] doing well.  ROS:  A ROS was performed and pertinent positives and negatives are included.  Exam:  Filed Vitals:   04/01/14 0858  BP: 119/78    General appearance:  Normal Thyroid:  Symmetrical, normal in size, without palpable masses or nodularity. Respiratory  Auscultation:  Clear without wheezing or rhonchi Cardiovascular  Auscultation:  Regular rate, without rubs, murmurs or gallops  Edema/varicosities:  Not grossly evident Abdominal  Soft,nontender, without masses, guarding or rebound.  Liver/spleen:  No organomegaly noted  Hernia:  None appreciated  Skin  Inspection:  Grossly normal   Breasts: Examined lying and sitting.     Right: Without masses, retractions, discharge or axillary adenopathy.     Left: Without masses, retractions, discharge or axillary adenopathy. Gentitourinary   Inguinal/mons:  Normal without inguinal adenopathy  External genitalia:  Normal  BUS/Urethra/Skene's glands:  Normal  Vagina:  Normal  Cervix:  Normal  Uterus:   normal in size, shape and contour.  Midline and mobile  Adnexa/parametria:     Rt: Without masses or tenderness.   Lt: Without masses or tenderness.  Anus and perineum: Normal  Digital rectal exam: Normal sphincter tone without palpated masses or tenderness  Assessment/Plan:  53 y.o. MWF G1 P1 for annual exam with no complaints.  Hypothyroid-on no  medication Light monthly cycle on off beyond with increased menopausal symptoms.  Plan: Will check FSH reviewed continuing Aviane 1 more year and stopping. SBE's, continue annual mammogram, calcium rich diet, vitamin D 1000 daily encouraged. Reviewed importance of increasing exercise for bone and heart health. CBC, glucose, lipid panel, TSH, UA, Pap normal 2014, new screening guidelines reviewed.  Harrington ChallengerYOUNG,NANCY J WHNP, 1:34 PM 04/01/2014

## 2014-04-01 NOTE — Patient Instructions (Signed)
Health Recommendations for Postmenopausal Women Respected and ongoing research has looked at the most common causes of death, disability, and poor quality of life in postmenopausal women. The causes include heart disease, diseases of blood vessels, diabetes, depression, cancer, and bone loss (osteoporosis). Many things can be done to help lower the chances of developing these and other common problems. CARDIOVASCULAR DISEASE Heart Disease: A heart attack is a medical emergency. Know the signs and symptoms of a heart attack. Below are things women can do to reduce their risk for heart disease.   Do not smoke. If you smoke, quit.  Aim for a healthy weight. Being overweight causes many preventable deaths. Eat a healthy and balanced diet and drink an adequate amount of liquids.  Get moving. Make a commitment to be more physically active. Aim for 30 minutes of activity on most, if not all days of the week.  Eat for heart health. Choose a diet that is low in saturated fat and cholesterol and eliminate trans fat. Include whole grains, vegetables, and fruits. Read and understand the labels on food containers before buying.  Know your numbers. Ask your caregiver to check your blood pressure, cholesterol (total, HDL, LDL, triglycerides) and blood glucose. Work with your caregiver on improving your entire clinical picture.  High blood pressure. Limit or stop your table salt intake (try salt substitute and food seasonings). Avoid salty foods and drinks. Read labels on food containers before buying. Eating well and exercising can help control high blood pressure. STROKE  Stroke is a medical emergency. Stroke may be the result of a blood clot in a blood vessel in the brain or by a brain hemorrhage (bleeding). Know the signs and symptoms of a stroke. To lower the risk of developing a stroke:  Avoid fatty foods.  Quit smoking.  Control your diabetes, blood pressure, and irregular heart rate. THROMBOPHLEBITIS  (BLOOD CLOT) OF THE LEG  Becoming overweight and leading a stationary lifestyle may also contribute to developing blood clots. Controlling your diet and exercising will help lower the risk of developing blood clots. CANCER SCREENING  Breast Cancer: Take steps to reduce your risk of breast cancer.  You should practice "breast self-awareness." This means understanding the normal appearance and feel of your breasts and should include breast self-examination. Any changes detected, no matter how small, should be reported to your caregiver.  After age 40, you should have a clinical breast exam (CBE) every year.  Starting at age 40, you should consider having a mammogram (breast X-ray) every year.  If you have a family history of breast cancer, talk to your caregiver about genetic screening.  If you are at high risk for breast cancer, talk to your caregiver about having an MRI and a mammogram every year.  Intestinal or Stomach Cancer: Tests to consider are a rectal exam, fecal occult blood, sigmoidoscopy, and colonoscopy. Women who are high risk may need to be screened at an earlier age and more often.  Cervical Cancer:  Beginning at age 30, you should have a Pap test every 3 years as long as the past 3 Pap tests have been normal.  If you have had past treatment for cervical cancer or a condition that could lead to cancer, you need Pap tests and screening for cancer for at least 20 years after your treatment.  If you had a hysterectomy for a problem that was not cancer or a condition that could lead to cancer, then you no longer need Pap tests.    If you are between ages 65 and 70, and you have had normal Pap tests going back 10 years, you no longer need Pap tests.  If Pap tests have been discontinued, risk factors (such as a new sexual partner) need to be reassessed to determine if screening should be resumed.  Some medical problems can increase the chance of getting cervical cancer. In these  cases, your caregiver may recommend more frequent screening and Pap tests.  Uterine Cancer: If you have vaginal bleeding after reaching menopause, you should notify your caregiver.  Ovarian Cancer: Other than yearly pelvic exams, there are no reliable tests available to screen for ovarian cancer at this time except for yearly pelvic exams.  Lung Cancer: Yearly chest X-rays can detect lung cancer and should be done on high risk women, such as cigarette smokers and women with chronic lung disease (emphysema).  Skin Cancer: A complete body skin exam should be done at your yearly examination. Avoid overexposure to the sun and ultraviolet light lamps. Use a strong sun block cream when in the sun. All of these things are important for lowering the risk of skin cancer. MENOPAUSE Menopause Symptoms: Hormone therapy products are effective for treating symptoms associated with menopause:  Moderate to severe hot flashes.  Night sweats.  Mood swings.  Headaches.  Tiredness.  Loss of sex drive.  Insomnia.  Other symptoms. Hormone replacement carries certain risks, especially in older women. Women who use or are thinking about using estrogen or estrogen with progestin treatments should discuss that with their caregiver. Your caregiver will help you understand the benefits and risks. The ideal dose of hormone replacement therapy is not known. The Food and Drug Administration (FDA) has concluded that hormone therapy should be used only at the lowest doses and for the shortest amount of time to reach treatment goals.  OSTEOPOROSIS Protecting Against Bone Loss and Preventing Fracture If you use hormone therapy for prevention of bone loss (osteoporosis), the risks for bone loss must outweigh the risk of the therapy. Ask your caregiver about other medications known to be safe and effective for preventing bone loss and fractures. To guard against bone loss or fractures, the following is recommended:  If  you are younger than age 50, take 1000 mg of calcium and at least 600 mg of Vitamin D per day.  If you are older than age 50 but younger than age 70, take 1200 mg of calcium and at least 600 mg of Vitamin D per day.  If you are older than age 70, take 1200 mg of calcium and at least 800 mg of Vitamin D per day. Smoking and excessive alcohol intake increases the risk of osteoporosis. Eat foods rich in calcium and vitamin D and do weight bearing exercises several times a week as your caregiver suggests. DIABETES Diabetes Mellitus: If you have type I or type 2 diabetes, you should keep your blood sugar under control with diet, exercise, and recommended medication. Avoid starchy and fatty foods, and too many sweets. Being overweight can make diabetes control more difficult. COGNITION AND MEMORY Cognition and Memory: Menopausal hormone therapy is not recommended for the prevention of cognitive disorders such as Alzheimer's disease or memory loss.  DEPRESSION  Depression may occur at any age, but it is common in elderly women. This may be because of physical, medical, social (loneliness), or financial problems and needs. If you are experiencing depression because of medical problems and control of symptoms, talk to your caregiver about this. Physical   activity and exercise may help with mood and sleep. Community and volunteer involvement may improve your sense of value and worth. If you have depression and you feel that the problem is getting worse or becoming severe, talk to your caregiver about which treatment options are best for you. ACCIDENTS  Accidents are common and can be serious in elderly woman. Prepare your house to prevent accidents. Eliminate throw rugs, place hand bars in bath, shower, and toilet areas. Avoid wearing high heeled shoes or walking on wet, snowy, and icy areas. Limit or stop driving if you have vision or hearing problems, or if you feel you are unsteady with your movements and  reflexes. HEPATITIS C Hepatitis C is a type of viral infection affecting the liver. It is spread mainly through contact with blood from an infected person. It can be treated, but if left untreated, it can lead to severe liver damage over the years. Many people who are infected do not know that the virus is in their blood. If you are a "baby-boomer", it is recommended that you have one screening test for Hepatitis C. IMMUNIZATIONS  Several immunizations are important to consider having during your senior years, including:   Tetanus, diphtheria, and pertussis booster shot.  Influenza every year before the flu season begins.  Pneumonia vaccine.  Shingles vaccine.  Others, as indicated based on your specific needs. Talk to your caregiver about these. Document Released: 05/12/2005 Document Revised: 08/04/2013 Document Reviewed: 01/06/2008 ExitCare Patient Information 2015 [employer]. This information is not intended to replace advice given to you by your health care provider. Make sure you discuss any questions you have with your health care provider.  

## 2014-06-29 ENCOUNTER — Other Ambulatory Visit: Payer: Self-pay

## 2014-06-29 DIAGNOSIS — Z3041 Encounter for surveillance of contraceptive pills: Secondary | ICD-10-CM

## 2014-06-29 MED ORDER — LEVONORGESTREL-ETHINYL ESTRAD 0.1-20 MG-MCG PO TABS: 1.0000 | ORAL_TABLET | Freq: Every day | ORAL | Status: DC

## 2014-06-29 NOTE — Telephone Encounter (Signed)
Yes continue her FSH was 2.  Please refill thru 12-16  thanks

## 2014-06-29 NOTE — Telephone Encounter (Signed)
Your last note says "continue one more year before stopping" but you only gave her one pack. I wondered if you meant continue one more month before stopping?  Please advise regarding refills. Thanks

## 2015-03-30 ENCOUNTER — Other Ambulatory Visit: Payer: Self-pay

## 2015-03-30 DIAGNOSIS — Z1231 Encounter for screening mammogram for malignant neoplasm of breast: Secondary | ICD-10-CM

## 2015-05-18 ENCOUNTER — Ambulatory Visit (INDEPENDENT_AMBULATORY_CARE_PROVIDER_SITE_OTHER): Payer: BC Managed Care – PPO | Admitting: Women's Health

## 2015-05-18 ENCOUNTER — Encounter: Payer: Self-pay | Admitting: Women's Health

## 2015-05-18 ENCOUNTER — Other Ambulatory Visit (HOSPITAL_COMMUNITY)
Admission: RE | Admit: 2015-05-18 | Discharge: 2015-05-18 | Disposition: A | Discharge status: Home / Self Care | Payer: BC Managed Care – PPO | Source: Ambulatory Visit | Attending: Women's Health | Admitting: Women's Health

## 2015-05-18 ENCOUNTER — Ambulatory Visit
Admission: RE | Admit: 2015-05-18 | Discharge: 2015-05-18 | Disposition: A | Discharge status: Home / Self Care | Payer: BC Managed Care – PPO | Source: Ambulatory Visit

## 2015-05-18 VITALS — BP 130/82 | Ht 69.0 in | Wt 195.0 lb

## 2015-05-18 DIAGNOSIS — Z1151 Encounter for screening for human papillomavirus (HPV): Secondary | ICD-10-CM | POA: Diagnosis present

## 2015-05-18 DIAGNOSIS — Z1329 Encounter for screening for other suspected endocrine disorder: Secondary | ICD-10-CM | POA: Diagnosis not present

## 2015-05-18 DIAGNOSIS — Z13220 Encounter for screening for lipoid disorders: Secondary | ICD-10-CM

## 2015-05-18 DIAGNOSIS — Z3041 Encounter for surveillance of contraceptive pills: Secondary | ICD-10-CM | POA: Diagnosis not present

## 2015-05-18 DIAGNOSIS — Z01419 Encounter for gynecological examination (general) (routine) without abnormal findings: Secondary | ICD-10-CM

## 2015-05-18 DIAGNOSIS — N951 Menopausal and female climacteric states: Secondary | ICD-10-CM

## 2015-05-18 DIAGNOSIS — Z1231 Encounter for screening mammogram for malignant neoplasm of breast: Secondary | ICD-10-CM

## 2015-05-18 LAB — COMPREHENSIVE METABOLIC PANEL
ALBUMIN: 3.8 g/dL (ref 3.6–5.1)
ALK PHOS: 73 U/L (ref 33–130)
ALT: 10 U/L (ref 6–29)
AST: 13 U/L (ref 10–35)
BUN: 10 mg/dL (ref 7–25)
CHLORIDE: 104 mmol/L (ref 98–110)
CO2: 25 mmol/L (ref 20–31)
CREATININE: 1.16 mg/dL — AB (ref 0.50–1.05)
Calcium: 9.1 mg/dL (ref 8.6–10.4)
Glucose, Bld: 96 mg/dL (ref 65–99)
Potassium: 3.9 mmol/L (ref 3.5–5.3)
SODIUM: 136 mmol/L (ref 135–146)
TOTAL PROTEIN: 6.9 g/dL (ref 6.1–8.1)
Total Bilirubin: 0.5 mg/dL (ref 0.2–1.2)

## 2015-05-18 LAB — CBC WITH DIFFERENTIAL/PLATELET
BASOS ABS: 0 10*3/uL (ref 0.0–0.1)
BASOS PCT: 0 % (ref 0–1)
Eosinophils Absolute: 0.1 10*3/uL (ref 0.0–0.7)
Eosinophils Relative: 1 % (ref 0–5)
HEMATOCRIT: 43 % (ref 36.0–46.0)
Hemoglobin: 14.4 g/dL (ref 12.0–15.0)
Lymphocytes Relative: 31 % (ref 12–46)
Lymphs Abs: 2.1 10*3/uL (ref 0.7–4.0)
MCH: 31.6 pg (ref 26.0–34.0)
MCHC: 33.5 g/dL (ref 30.0–36.0)
MCV: 94.3 fL (ref 78.0–100.0)
MPV: 10.5 fL (ref 8.6–12.4)
Monocytes Absolute: 0.4 10*3/uL (ref 0.1–1.0)
Monocytes Relative: 6 % (ref 3–12)
Neutro Abs: 4.3 10*3/uL (ref 1.7–7.7)
Neutrophils Relative %: 62 % (ref 43–77)
Platelets: 217 10*3/uL (ref 150–400)
RBC: 4.56 MIL/uL (ref 3.87–5.11)
RDW: 13.5 % (ref 11.5–15.5)
WBC: 6.9 10*3/uL (ref 4.0–10.5)

## 2015-05-18 LAB — LIPID PANEL
CHOLESTEROL: 211 mg/dL — AB (ref 125–200)
HDL: 44 mg/dL — AB (ref >=46)
LDL CALC: 139 mg/dL — AB (ref <130)
TRIGLYCERIDES: 141 mg/dL (ref <150)
Total CHOL/HDL Ratio: 4.8 Ratio (ref <=5.0)
VLDL: 28 mg/dL (ref <30)

## 2015-05-18 LAB — TSH: TSH: 2.82 mIU/L

## 2015-05-18 MED ORDER — LEVONORGESTREL-ETHINYL ESTRAD 0.1-20 MG-MCG PO TABS: 1.0000 | ORAL_TABLET | Freq: Every day | ORAL | Status: DC

## 2015-05-18 MED ORDER — LEVONORGESTREL-ETHINYL ESTRAD 0.1-20 MG-MCG PO TABS: ORAL_TABLET | ORAL | Status: DC

## 2015-05-18 NOTE — Patient Instructions (Signed)

## 2015-05-18 NOTE — Progress Notes (Signed)
Brittany Flowers February 12, 1961 [id number]    History:    Presents for annual exam.  Monthly cycle on Orsythia   Reports sisters were in their late 58s for menopause. Normal Pap and mammogram history. Has lost 12 pounds in the past year with diet and exercise. 2014 negative colonoscopy. History of GDM. History of hyperthyroidism with no medication or treatment per endocrinologist.  Past medical history, past surgical history, family history and social history were all reviewed and documented in the EPIC chart. Geologist, engineering and bus driver. Brittany Flowers 20 at [employer] [employer] [facility] [employer] doing well. Father diabetes and heart disease.  ROS:  A ROS was performed and pertinent positives and negatives are included.  Exam:  Filed Vitals:   05/18/15 0853  BP: 130/82    General appearance:  Normal Thyroid:  Symmetrical, normal in size, without palpable masses or nodularity. Respiratory  Auscultation:  Clear without wheezing or rhonchi Cardiovascular  Auscultation:  Regular rate, without rubs, murmurs or gallops  Edema/varicosities:  Not grossly evident Abdominal  Soft,nontender, without masses, guarding or rebound.  Liver/spleen:  No organomegaly noted  Hernia:  None appreciated  Skin  Inspection:  Grossly normal   Breasts: Examined lying and sitting.     Right: Without masses, retractions, discharge or axillary adenopathy.     Left: Without masses, retractions, discharge or axillary adenopathy. Gentitourinary   Inguinal/mons:  Normal without inguinal adenopathy  External genitalia:  Normal  BUS/Urethra/Skene's glands:  Normal  Vagina:  Mild atrophy  Cervix:  Normal  Uterus:   normal in size, shape and contour.  Midline and mobile  Adnexa/parametria:     Rt: Without masses or tenderness.   Lt: Without masses or tenderness.  Anus and perineum: Normal  Digital rectal exam: Normal sphincter tone without palpated masses or tenderness  Assessment/Plan:  55 y.o. MWF G1 P1 for annual exam with complaint of  occasional vaginal dryness and hot flushes.  Monthly cycle on OCs with mild menopausal symptoms  Plan: Contraception options reviewed will continue on Orsythia, prescription, proper use, slight risk for blood clots and strokes reviewed. Will take continuously, skip placebo week. Instructed to call. Stop OCs week prior to annual exam next year will check FSH. SBE's, continue annual screening mammogram, calcium rich diet, vitamin D 1000 daily encouraged. Continue healthy lifestyle of diet and exercise for continued weight loss. Congratulated on 15 pounds. CBC, CMP, lipid panel, TSH, vitamin D, TSH, UA, Pap with HR HPV typing, new screening guidelines reviewed.   [name] [facility], 9:21 AM 05/18/2015

## 2015-05-18 NOTE — Addendum Note (Signed)
Addended by: Kem Parkinson on: 05/18/2015 09:57 AM   Modules accepted: Orders

## 2015-05-19 ENCOUNTER — Encounter: Payer: Self-pay | Admitting: Women's Health

## 2015-05-19 LAB — URINALYSIS W MICROSCOPIC + REFLEX CULTURE
BACTERIA UA: NONE SEEN [HPF]
Bilirubin Urine: NEGATIVE
CRYSTALS: NONE SEEN [HPF]
Casts: NONE SEEN [LPF]
GLUCOSE, UA: NEGATIVE
KETONES UR: NEGATIVE
LEUKOCYTES UA: NEGATIVE
Nitrite: NEGATIVE
PROTEIN: NEGATIVE
RBC / HPF: NONE SEEN RBC/HPF (ref <=2)
SQUAMOUS EPITHELIAL / LPF: NONE SEEN [HPF] (ref <=5)
Specific Gravity, Urine: 1.005 (ref 1.001–1.035)
Yeast: NONE SEEN [HPF]
pH: 5.5 (ref 5.0–8.0)

## 2015-05-19 LAB — VITAMIN D 25 HYDROXY (VIT D DEFICIENCY, FRACTURES): Vit D, 25-Hydroxy: 33 ng/mL (ref 30–100)

## 2015-05-19 LAB — CYTOLOGY - PAP

## 2015-05-19 LAB — FOLLICLE STIMULATING HORMONE: FSH: 4.5 m[IU]/mL

## 2015-05-20 ENCOUNTER — Encounter: Payer: Self-pay | Admitting: Women's Health

## 2015-05-20 LAB — URINE CULTURE
Colony Count: NO GROWTH
Organism ID, Bacteria: NO GROWTH

## 2016-01-11 ENCOUNTER — Ambulatory Visit: Payer: BC Managed Care – PPO

## 2016-04-10 ENCOUNTER — Other Ambulatory Visit: Payer: Self-pay | Admitting: Women's Health

## 2016-04-10 DIAGNOSIS — Z1231 Encounter for screening mammogram for malignant neoplasm of breast: Secondary | ICD-10-CM

## 2016-05-18 ENCOUNTER — Ambulatory Visit (INDEPENDENT_AMBULATORY_CARE_PROVIDER_SITE_OTHER): Payer: BC Managed Care – PPO | Admitting: Women's Health

## 2016-05-18 ENCOUNTER — Ambulatory Visit
Admission: RE | Admit: 2016-05-18 | Discharge: 2016-05-18 | Disposition: A | Discharge status: Home / Self Care | Payer: BC Managed Care – PPO | Source: Ambulatory Visit | Attending: Women's Health | Admitting: Women's Health

## 2016-05-18 ENCOUNTER — Encounter: Payer: Self-pay | Admitting: Women's Health

## 2016-05-18 VITALS — BP 130/84 | Ht 67.0 in | Wt 202.0 lb

## 2016-05-18 DIAGNOSIS — Z13220 Encounter for screening for lipoid disorders: Secondary | ICD-10-CM | POA: Diagnosis not present

## 2016-05-18 DIAGNOSIS — Z01419 Encounter for gynecological examination (general) (routine) without abnormal findings: Secondary | ICD-10-CM

## 2016-05-18 DIAGNOSIS — E0590 Thyrotoxicosis, unspecified without thyrotoxic crisis or storm: Secondary | ICD-10-CM

## 2016-05-18 DIAGNOSIS — Z3041 Encounter for surveillance of contraceptive pills: Secondary | ICD-10-CM

## 2016-05-18 DIAGNOSIS — N912 Amenorrhea, unspecified: Secondary | ICD-10-CM

## 2016-05-18 DIAGNOSIS — Z1231 Encounter for screening mammogram for malignant neoplasm of breast: Secondary | ICD-10-CM

## 2016-05-18 DIAGNOSIS — Z79890 Hormone replacement therapy (postmenopausal): Secondary | ICD-10-CM

## 2016-05-18 LAB — CBC WITH DIFFERENTIAL/PLATELET
BASOS ABS: 0 {cells}/uL (ref 0–200)
Basophils Relative: 0 %
EOS ABS: 65 {cells}/uL (ref 15–500)
Eosinophils Relative: 1 %
HEMATOCRIT: 43.4 % (ref 35.0–45.0)
Hemoglobin: 14.6 g/dL (ref 11.7–15.5)
LYMPHS PCT: 31 %
Lymphs Abs: 2015 cells/uL (ref 850–3900)
MCH: 32.2 pg (ref 27.0–33.0)
MCHC: 33.6 g/dL (ref 32.0–36.0)
MCV: 95.6 fL (ref 80.0–100.0)
MPV: 10 fL (ref 7.5–12.5)
Monocytes Absolute: 585 cells/uL (ref 200–950)
Monocytes Relative: 9 %
Neutro Abs: 3835 cells/uL (ref 1500–7800)
Neutrophils Relative %: 59 %
PLATELETS: 252 10*3/uL (ref 140–400)
RBC: 4.54 MIL/uL (ref 3.80–5.10)
RDW: 13.3 % (ref 11.0–15.0)
WBC: 6.5 10*3/uL (ref 3.8–10.8)

## 2016-05-18 LAB — COMPREHENSIVE METABOLIC PANEL
ALK PHOS: 61 U/L (ref 33–130)
ALT: 10 U/L (ref 6–29)
AST: 15 U/L (ref 10–35)
Albumin: 3.9 g/dL (ref 3.6–5.1)
BUN: 12 mg/dL (ref 7–25)
CALCIUM: 8.8 mg/dL (ref 8.6–10.4)
CHLORIDE: 104 mmol/L (ref 98–110)
CO2: 22 mmol/L (ref 20–31)
Creat: 1.12 mg/dL — ABNORMAL HIGH (ref 0.50–1.05)
GLUCOSE: 103 mg/dL — AB (ref 65–99)
POTASSIUM: 4.3 mmol/L (ref 3.5–5.3)
Sodium: 137 mmol/L (ref 135–146)
Total Bilirubin: 0.6 mg/dL (ref 0.2–1.2)
Total Protein: 7.1 g/dL (ref 6.1–8.1)

## 2016-05-18 LAB — LIPID PANEL
Cholesterol: 218 mg/dL — ABNORMAL HIGH (ref <200)
HDL: 51 mg/dL (ref >50)
LDL Cholesterol: 140 mg/dL — ABNORMAL HIGH (ref <100)
Total CHOL/HDL Ratio: 4.3 Ratio (ref <5.0)
Triglycerides: 136 mg/dL (ref <150)
VLDL: 27 mg/dL (ref <30)

## 2016-05-18 LAB — TSH: TSH: 2.21 m[IU]/L

## 2016-05-18 MED ORDER — ESTRADIOL-NORETHINDRONE ACET 0.5-0.1 MG PO TABS: 1.0000 | ORAL_TABLET | Freq: Every day | ORAL | 4 refills | Status: DC

## 2016-05-18 MED ORDER — LEVONORGESTREL-ETHINYL ESTRAD 0.1-20 MG-MCG PO TABS: ORAL_TABLET | ORAL | 4 refills | Status: DC

## 2016-05-18 NOTE — Progress Notes (Signed)
Brittany Flowers 1960/06/14 [id number]    History:    Presents for annual exam. No changes in health since last visit.  Monthly cycles on Orsythia, sexually active. Minimal menopausal sxs, family members menopausal in late 6850s. Nml pap and mammogram hx. thousand 14 negative colonoscopy. History of hyperthyroidism with normal thyroid after, history of GDM.  Past medical history, past surgical history, family history and social history were all reviewed and documented in the EPIC chart. Works as Office managerTA and Midwifebus driver. Daughter is about to graduate as a Runner, broadcasting/film/videoteacher in the spring.  Father has diabetes and heart disease. Husband has hypothyroidism.   ROS:  A ROS was performed and pertinent positives and negatives are included.  Exam:  Vitals:   05/18/16 0905  BP: 130/84  Weight: 202 lb (91.6 kg)  Height: 5\' 7"  (1.702 m)   Body mass index is 31.64 kg/m.   General appearance:  Normal Thyroid:  Symmetrical, normal in size, without palpable masses or nodularity. Respiratory  Auscultation:  Clear without wheezing or rhonchi Cardiovascular  Auscultation:  Regular rate, without rubs, murmurs or gallops  Edema/varicosities:  Not grossly evident Abdominal  Soft,nontender, without masses, guarding or rebound.  Liver/spleen:  No organomegaly noted  Hernia:  None appreciated  Skin  Inspection:  Grossly normal   Breasts: Examined lying and sitting.     Right: Without masses, retractions, discharge or axillary adenopathy.     Left: Without masses, retractions, discharge or axillary adenopathy. Gentitourinary   Inguinal/mons:  Normal without inguinal adenopathy  External genitalia:  Normal  BUS/Urethra/Skene's glands:  Normal  Vagina:  Drier than normal; pain with speculum exam  Cervix:  Normal  Uterus:   normal in size, shape and contour.  Midline and mobile  Adnexa/parametria:     Rt: Without masses or tenderness.   Lt: Without masses or tenderness.  Anus and perineum: Normal  Digital rectal  exam: Normal sphincter tone without palpated masses or tenderness  Assessment/Plan:  56 y.o. MWF G1 P1  for annual exam.  Monthly cycle on OCPs  Obesity  Plan: Contraception refill sent, will continue on Orsythia. Aware of proper use, risk for blood clots and strokes. Reviewed will check FSH is elevated will change to Activella 0.5/0.1. SBE's, continue annual mammogram, increase regular exercise, decrease calories, vitamin D 2000 daily encouraged. TSH, CBC, CMP, lipid panel, UA, vit D . Normal with negative HR HPV 2017, new screening guidelines reviewed.   Harrington ChallengerYOUNG,NANCY J [facility] LtdWHNP, 10:45 AM 05/18/2016

## 2016-05-18 NOTE — Patient Instructions (Addendum)
If Baker up continue the OC's  If high the HRT  Estradiol .5/ norethendrone  Health Maintenance for Postmenopausal Women Introduction Menopause is a normal process in which your reproductive ability comes to an end. This process happens gradually over a span of months to years, usually between the ages of 27 and 23. Menopause is complete when you have missed 12 consecutive menstrual periods. It is important to talk with your health care provider about some of the most common conditions that affect postmenopausal women, such as heart disease, cancer, and bone loss (osteoporosis). Adopting a healthy lifestyle and getting preventive care can help to promote your health and wellness. Those actions can also lower your chances of developing some of these common conditions. What should I know about menopause? During menopause, you may experience a number of symptoms, such as:  Moderate-to-severe hot flashes.  Night sweats.  Decrease in sex drive.  Mood swings.  Headaches.  Tiredness.  Irritability.  Memory problems.  Insomnia. Choosing to treat or not to treat menopausal changes is an individual decision that you make with your health care provider. What should I know about hormone replacement therapy and supplements? Hormone therapy products are effective for treating symptoms that are associated with menopause, such as hot flashes and night sweats. Hormone replacement carries certain risks, especially as you become older. If you are thinking about using estrogen or estrogen with progestin treatments, discuss the benefits and risks with your health care provider. What should I know about heart disease and stroke? Heart disease, heart attack, and stroke become more likely as you age. This may be due, in part, to the hormonal changes that your body experiences during menopause. These can affect how your body processes dietary fats, triglycerides, and cholesterol. Heart attack and stroke are both  medical emergencies. There are many things that you can do to help prevent heart disease and stroke:  Have your blood pressure checked at least every 1-2 years. High blood pressure causes heart disease and increases the risk of stroke.  If you are 21-10 years old, ask your health care provider if you should take aspirin to prevent a heart attack or a stroke.  Do not use any tobacco products, including cigarettes, chewing tobacco, or electronic cigarettes. If you need help quitting, ask your health care provider.  It is important to eat a healthy diet and maintain a healthy weight.  Be sure to include plenty of vegetables, fruits, low-fat dairy products, and lean protein.  Avoid eating foods that are high in solid fats, added sugars, or salt (sodium).  Get regular exercise. This is one of the most important things that you can do for your health.  Try to exercise for at least 150 minutes each week. The type of exercise that you do should increase your heart rate and make you sweat. This is known as moderate-intensity exercise.  Try to do strengthening exercises at least twice each week. Do these in addition to the moderate-intensity exercise.  Know your numbers.Ask your health care provider to check your cholesterol and your blood glucose. Continue to have your blood tested as directed by your health care provider. What should I know about cancer screening? There are several types of cancer. Take the following steps to reduce your risk and to catch any cancer development as early as possible. Breast Cancer  Practice breast self-awareness.  This means understanding how your breasts normally appear and feel.  It also means doing regular breast self-exams. Let your health  care provider know about any changes, no matter how small.  If you are 34 or older, have a clinician do a breast exam (clinical breast exam or CBE) every year. Depending on your age, family history, and medical history,  it may be recommended that you also have a yearly breast X-ray (mammogram).  If you have a family history of breast cancer, talk with your health care provider about genetic screening.  If you are at high risk for breast cancer, talk with your health care provider about having an MRI and a mammogram every year.  Breast cancer (BRCA) gene test is recommended for women who have family members with BRCA-related cancers. Results of the assessment will determine the need for genetic counseling and BRCA1 and for BRCA2 testing. BRCA-related cancers include these types:  Breast. This occurs in males or females.  Ovarian.  Tubal. This may also be called fallopian tube cancer.  Cancer of the abdominal or pelvic lining (peritoneal cancer).  Prostate.  Pancreatic. Cervical, Uterine, and Ovarian Cancer  Your health care provider may recommend that you be screened regularly for cancer of the pelvic organs. These include your ovaries, uterus, and vagina. This screening involves a pelvic exam, which includes checking for microscopic changes to the surface of your cervix (Pap test).  For women ages 21-65, health care providers may recommend a pelvic exam and a Pap test every three years. For women ages 55-65, they may recommend the Pap test and pelvic exam, combined with testing for human papilloma virus (HPV), every five years. Some types of HPV increase your risk of cervical cancer. Testing for HPV may also be done on women of any age who have unclear Pap test results.  Other health care providers may not recommend any screening for nonpregnant women who are considered low risk for pelvic cancer and have no symptoms. Ask your health care provider if a screening pelvic exam is right for you.  If you have had past treatment for cervical cancer or a condition that could lead to cancer, you need Pap tests and screening for cancer for at least 20 years after your treatment. If Pap tests have been discontinued  for you, your risk factors (such as having a new sexual partner) need to be reassessed to determine if you should start having screenings again. Some women have medical problems that increase the chance of getting cervical cancer. In these cases, your health care provider may recommend that you have screening and Pap tests more often.  If you have a family history of uterine cancer or ovarian cancer, talk with your health care provider about genetic screening.  If you have vaginal bleeding after reaching menopause, tell your health care provider.  There are currently no reliable tests available to screen for ovarian cancer. Lung Cancer  Lung cancer screening is recommended for adults 66-51 years old who are at high risk for lung cancer because of a history of smoking. A yearly low-dose CT scan of the lungs is recommended if you:  Currently smoke.  Have a history of at least 30 pack-years of smoking and you currently smoke or have quit within the past 15 years. A pack-year is smoking an average of one pack of cigarettes per day for one year. Yearly screening should:  Continue until it has been 15 years since you quit.  Stop if you develop a health problem that would prevent you from having lung cancer treatment. Colorectal Cancer  This type of cancer can be detected  and can often be prevented.  Routine colorectal cancer screening usually begins at age 35 and continues through age 43.  If you have risk factors for colon cancer, your health care provider may recommend that you be screened at an earlier age.  If you have a family history of colorectal cancer, talk with your health care provider about genetic screening.  Your health care provider may also recommend using home test kits to check for hidden blood in your stool.  A small camera at the end of a tube can be used to examine your colon directly (sigmoidoscopy or colonoscopy). This is done to check for the earliest forms of  colorectal cancer.  Direct examination of the colon should be repeated every 5-10 years until age 66. However, if early forms of precancerous polyps or small growths are found or if you have a family history or genetic risk for colorectal cancer, you may need to be screened more often. Skin Cancer  Check your skin from head to toe regularly.  Monitor any moles. Be sure to tell your health care provider:  About any new moles or changes in moles, especially if there is a change in a mole's shape or color.  If you have a mole that is larger than the size of a pencil eraser.  If any of your family members has a history of skin cancer, especially at a Juanmanuel Marohl age, talk with your health care provider about genetic screening.  Always use sunscreen. Apply sunscreen liberally and repeatedly throughout the day.  Whenever you are outside, protect yourself by wearing long sleeves, pants, a wide-brimmed hat, and sunglasses. What should I know about osteoporosis? Osteoporosis is a condition in which bone destruction happens more quickly than new bone creation. After menopause, you may be at an increased risk for osteoporosis. To help prevent osteoporosis or the bone fractures that can happen because of osteoporosis, the following is recommended:  If you are 30-64 years old, get at least 1,000 mg of calcium and at least 600 mg of vitamin D per day.  If you are older than age 48 but younger than age 52, get at least 1,200 mg of calcium and at least 600 mg of vitamin D per day.  If you are older than age 55, get at least 1,200 mg of calcium and at least 800 mg of vitamin D per day. Smoking and excessive alcohol intake increase the risk of osteoporosis. Eat foods that are rich in calcium and vitamin D, and do weight-bearing exercises several times each week as directed by your health care provider. What should I know about how menopause affects my mental health? Depression may occur at any age, but it is  more common as you become older. Common symptoms of depression include:  Low or sad mood.  Changes in sleep patterns.  Changes in appetite or eating patterns.  Feeling an overall lack of motivation or enjoyment of activities that you previously enjoyed.  Frequent crying spells. Talk with your health care provider if you think that you are experiencing depression. What should I know about immunizations? It is important that you get and maintain your immunizations. These include:  Tetanus, diphtheria, and pertussis (Tdap) booster vaccine.  Influenza every year before the flu season begins.  Pneumonia vaccine.  Shingles vaccine. Your health care provider may also recommend other immunizations. This information is not intended to replace advice given to you by your health care provider. Make sure you discuss any questions you have  with your health care provider. Document Released: 05/12/2005 Document Revised: 10/08/2015 Document Reviewed: 12/22/2014  2017 Elsevier

## 2016-05-19 LAB — FOLLICLE STIMULATING HORMONE: FSH: <0.7 m[IU]/mL

## 2016-05-26 ENCOUNTER — Other Ambulatory Visit: Payer: Self-pay | Admitting: Women's Health

## 2016-05-26 ENCOUNTER — Telehealth: Payer: Self-pay

## 2016-05-26 DIAGNOSIS — N951 Menopausal and female climacteric states: Secondary | ICD-10-CM

## 2016-05-26 NOTE — Telephone Encounter (Signed)
yes

## 2016-05-26 NOTE — Telephone Encounter (Signed)
-----   Message from Harrington ChallengerNancy J Young, NP sent at 05/19/2016  7:44 AM EST ----- Please call and review FSH is still low, she was on placebo week of pills? If so continue OCs for 1 more year. TSH, CBC, all normal.  Cholesterol slightly elevated, increase regular exercise, decrease saturated fat still less than 20 g daily, fish oil supplement, often even a 5 pound weight loss can often help cholesterol also.

## 2016-05-26 NOTE — Telephone Encounter (Signed)
[facility]. Best if we reche[facility]  [facility] one week of NO hormones or OCs. If FSH is low will need to continue on the Loestrin (birth control ) for 1 more year and we will check placebo week next year when she comes in. Have her save the pills that she currently is taking/ hormone replacement, don't throw out. Menopause will come, her sisters were in their later 1950's . Please tell her sorry for the confusion.

## 2016-05-26 NOTE — Telephone Encounter (Signed)
Encounter already opened. 

## 2016-05-26 NOTE — Telephone Encounter (Signed)
Patient reassured bleeding okay. Stay off hormones starting today as instructed in note. Patient will return end of next week to have FSH rechecked. Order placed. She wants to call for lab appt.

## 2016-05-26 NOTE — Telephone Encounter (Signed)
Reassure her about the bleeding she is currently having?

## 2016-05-26 NOTE — Telephone Encounter (Signed)
#  1  Patient was not on Placebo week [facility]. Patient said for the last year she has taken an active pill every single day with no placebos and no breaks.    #2 Patient reports that she started bleeding yesterday. It is light but continues today. She wanted to know why she is bleeding and should she be concerned.  #3  Pharmacy had two Rx's for her that you sent in. Pharmacy suggested that she pick up the new one that was not what she had previously been taking.  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  We agreed that I will call her on Monday morning and let her know what you recommend about these things.  If she finishes her route today before 4:45pm she will call me to see what you recommend.

## 2016-06-01 ENCOUNTER — Other Ambulatory Visit: Payer: Self-pay | Admitting: Women's Health

## 2016-06-01 ENCOUNTER — Other Ambulatory Visit: Payer: BC Managed Care – PPO

## 2016-06-01 DIAGNOSIS — Z3041 Encounter for surveillance of contraceptive pills: Secondary | ICD-10-CM

## 2016-06-01 DIAGNOSIS — N951 Menopausal and female climacteric states: Secondary | ICD-10-CM

## 2016-06-01 LAB — FOLLICLE STIMULATING HORMONE: FSH: 23.4 m[IU]/mL

## 2016-06-01 MED ORDER — LEVONORGESTREL-ETHINYL ESTRAD 0.1-20 MG-MCG PO TABS: ORAL_TABLET | ORAL | 4 refills | Status: DC

## 2016-08-16 ENCOUNTER — Encounter: Payer: Self-pay | Admitting: Gynecology

## 2017-04-16 ENCOUNTER — Other Ambulatory Visit: Payer: Self-pay | Admitting: Women's Health

## 2017-04-16 DIAGNOSIS — Z1231 Encounter for screening mammogram for malignant neoplasm of breast: Secondary | ICD-10-CM

## 2017-06-04 ENCOUNTER — Ambulatory Visit: Payer: BC Managed Care – PPO | Admitting: Women's Health

## 2017-06-04 ENCOUNTER — Encounter: Payer: Self-pay | Admitting: Women's Health

## 2017-06-04 ENCOUNTER — Ambulatory Visit
Admission: RE | Admit: 2017-06-04 | Discharge: 2017-06-04 | Disposition: A | Discharge status: Home / Self Care | Payer: BC Managed Care – PPO | Source: Ambulatory Visit | Attending: Women's Health | Admitting: Women's Health

## 2017-06-04 VITALS — BP 120/82 | Ht 67.0 in | Wt 202.0 lb

## 2017-06-04 DIAGNOSIS — Z13220 Encounter for screening for lipoid disorders: Secondary | ICD-10-CM | POA: Diagnosis not present

## 2017-06-04 DIAGNOSIS — E0590 Thyrotoxicosis, unspecified without thyrotoxic crisis or storm: Secondary | ICD-10-CM

## 2017-06-04 DIAGNOSIS — N912 Amenorrhea, unspecified: Secondary | ICD-10-CM

## 2017-06-04 DIAGNOSIS — Z01419 Encounter for gynecological examination (general) (routine) without abnormal findings: Secondary | ICD-10-CM

## 2017-06-04 DIAGNOSIS — Z1231 Encounter for screening mammogram for malignant neoplasm of breast: Secondary | ICD-10-CM

## 2017-06-04 NOTE — Patient Instructions (Signed)
Brisk walk 30 min daily  Vit D 2000 daily Health Maintenance for Postmenopausal Women Menopause is a normal process in which your reproductive ability comes to an end. This process happens gradually over a span of months to years, usually between the ages of 98 and 42. Menopause is complete when you have missed 12 consecutive menstrual periods. It is important to talk with your health care provider about some of the most common conditions that affect postmenopausal women, such as heart disease, cancer, and bone loss (osteoporosis). Adopting a healthy lifestyle and getting preventive care can help to promote your health and wellness. Those actions can also lower your chances of developing some of these common conditions. What should I know about menopause? During menopause, you may experience a number of symptoms, such as:  Moderate-to-severe hot flashes.  Night sweats.  Decrease in sex drive.  Mood swings.  Headaches.  Tiredness.  Irritability.  Memory problems.  Insomnia.  Choosing to treat or not to treat menopausal changes is an individual decision that you make with your health care provider. What should I know about hormone replacement therapy and supplements? Hormone therapy products are effective for treating symptoms that are associated with menopause, such as hot flashes and night sweats. Hormone replacement carries certain risks, especially as you become older. If you are thinking about using estrogen or estrogen with progestin treatments, discuss the benefits and risks with your health care provider. What should I know about heart disease and stroke? Heart disease, heart attack, and stroke become more likely as you age. This may be due, in part, to the hormonal changes that your body experiences during menopause. These can affect how your body processes dietary fats, triglycerides, and cholesterol. Heart attack and stroke are both medical emergencies. There are many things  that you can do to help prevent heart disease and stroke:  Have your blood pressure checked at least every 1-2 years. High blood pressure causes heart disease and increases the risk of stroke.  If you are 90-59 years old, ask your health care provider if you should take aspirin to prevent a heart attack or a stroke.  Do not use any tobacco products, including cigarettes, chewing tobacco, or electronic cigarettes. If you need help quitting, ask your health care provider.  It is important to eat a healthy diet and maintain a healthy weight. ? Be sure to include plenty of vegetables, fruits, low-fat dairy products, and lean protein. ? Avoid eating foods that are high in solid fats, added sugars, or salt (sodium).  Get regular exercise. This is one of the most important things that you can do for your health. ? Try to exercise for at least 150 minutes each week. The type of exercise that you do should increase your heart rate and make you sweat. This is known as moderate-intensity exercise. ? Try to do strengthening exercises at least twice each week. Do these in addition to the moderate-intensity exercise.  Know your numbers.Ask your health care provider to check your cholesterol and your blood glucose. Continue to have your blood tested as directed by your health care provider.  What should I know about cancer screening? There are several types of cancer. Take the following steps to reduce your risk and to catch any cancer development as early as possible. Breast Cancer  Practice breast self-awareness. ? This means understanding how your breasts normally appear and feel. ? It also means doing regular breast self-exams. Let your health care provider know about any  changes, no matter how small.  If you are 38 or older, have a clinician do a breast exam (clinical breast exam or CBE) every year. Depending on your age, family history, and medical history, it may be recommended that you also have  a yearly breast X-ray (mammogram).  If you have a family history of breast cancer, talk with your health care provider about genetic screening.  If you are at high risk for breast cancer, talk with your health care provider about having an MRI and a mammogram every year.  Breast cancer (BRCA) gene test is recommended for women who have family members with BRCA-related cancers. Results of the assessment will determine the need for genetic counseling and BRCA1 and for BRCA2 testing. BRCA-related cancers include these types: ? Breast. This occurs in males or females. ? Ovarian. ? Tubal. This may also be called fallopian tube cancer. ? Cancer of the abdominal or pelvic lining (peritoneal cancer). ? Prostate. ? Pancreatic.  Cervical, Uterine, and Ovarian Cancer Your health care provider may recommend that you be screened regularly for cancer of the pelvic organs. These include your ovaries, uterus, and vagina. This screening involves a pelvic exam, which includes checking for microscopic changes to the surface of your cervix (Pap test).  For women ages 21-65, health care providers may recommend a pelvic exam and a Pap test every three years. For women ages 68-65, they may recommend the Pap test and pelvic exam, combined with testing for human papilloma virus (HPV), every five years. Some types of HPV increase your risk of cervical cancer. Testing for HPV may also be done on women of any age who have unclear Pap test results.  Other health care providers may not recommend any screening for nonpregnant women who are considered low risk for pelvic cancer and have no symptoms. Ask your health care provider if a screening pelvic exam is right for you.  If you have had past treatment for cervical cancer or a condition that could lead to cancer, you need Pap tests and screening for cancer for at least 20 years after your treatment. If Pap tests have been discontinued for you, your risk factors (such as  having a new sexual partner) need to be reassessed to determine if you should start having screenings again. Some women have medical problems that increase the chance of getting cervical cancer. In these cases, your health care provider may recommend that you have screening and Pap tests more often.  If you have a family history of uterine cancer or ovarian cancer, talk with your health care provider about genetic screening.  If you have vaginal bleeding after reaching menopause, tell your health care provider.  There are currently no reliable tests available to screen for ovarian cancer.  Lung Cancer Lung cancer screening is recommended for adults 87-80 years old who are at high risk for lung cancer because of a history of smoking. A yearly low-dose CT scan of the lungs is recommended if you:  Currently smoke.  Have a history of at least 30 pack-years of smoking and you currently smoke or have quit within the past 15 years. A pack-year is smoking an average of one pack of cigarettes per day for one year.  Yearly screening should:  Continue until it has been 15 years since you quit.  Stop if you develop a health problem that would prevent you from having lung cancer treatment.  Colorectal Cancer  This type of cancer can be detected and can often  be prevented.  Routine colorectal cancer screening usually begins at age 55 and continues through age 23.  If you have risk factors for colon cancer, your health care provider may recommend that you be screened at an earlier age.  If you have a family history of colorectal cancer, talk with your health care provider about genetic screening.  Your health care provider may also recommend using home test kits to check for hidden blood in your stool.  A small camera at the end of a tube can be used to examine your colon directly (sigmoidoscopy or colonoscopy). This is done to check for the earliest forms of colorectal cancer.  Direct  examination of the colon should be repeated every 5-10 years until age 82. However, if early forms of precancerous polyps or small growths are found or if you have a family history or genetic risk for colorectal cancer, you may need to be screened more often.  Skin Cancer  Check your skin from head to toe regularly.  Monitor any moles. Be sure to tell your health care provider: ? About any new moles or changes in moles, especially if there is a change in a mole's shape or color. ? If you have a mole that is larger than the size of a pencil eraser.  If any of your family members has a history of skin cancer, especially at a Zymier Rodgers age, talk with your health care provider about genetic screening.  Always use sunscreen. Apply sunscreen liberally and repeatedly throughout the day.  Whenever you are outside, protect yourself by wearing long sleeves, pants, a wide-brimmed hat, and sunglasses.  What should I know about osteoporosis? Osteoporosis is a condition in which bone destruction happens more quickly than new bone creation. After menopause, you may be at an increased risk for osteoporosis. To help prevent osteoporosis or the bone fractures that can happen because of osteoporosis, the following is recommended:  If you are 53-79 years old, get at least 1,000 mg of calcium and at least 600 mg of vitamin D per day.  If you are older than age 60 but younger than age 20, get at least 1,200 mg of calcium and at least 600 mg of vitamin D per day.  If you are older than age 25, get at least 1,200 mg of calcium and at least 800 mg of vitamin D per day.  Smoking and excessive alcohol intake increase the risk of osteoporosis. Eat foods that are rich in calcium and vitamin D, and do weight-bearing exercises several times each week as directed by your health care provider. What should I know about how menopause affects my mental health? Depression may occur at any age, but it is more common as you become  older. Common symptoms of depression include:  Low or sad mood.  Changes in sleep patterns.  Changes in appetite or eating patterns.  Feeling an overall lack of motivation or enjoyment of activities that you previously enjoyed.  Frequent crying spells.  Talk with your health care provider if you think that you are experiencing depression. What should I know about immunizations? It is important that you get and maintain your immunizations. These include:  Tetanus, diphtheria, and pertussis (Tdap) booster vaccine.  Influenza every year before the flu season begins.  Pneumonia vaccine.  Shingles vaccine.  Your health care provider may also recommend other immunizations. This information is not intended to replace advice given to you by your health care provider. Make sure you discuss any questions  you have with your health care provider. Document Released: 05/12/2005 Document Revised: 10/08/2015 Document Reviewed: 12/22/2014 Elsevier Interactive Patient Education  2018 [employer].

## 2017-06-04 NOTE — Progress Notes (Signed)
Brittany Flowers 18-Sep-1960 [id number]    History:    Presents for annual exam. Light monthly cycle on Loestrin. Sisters late 5550s for menopause. FSH 23 placebo week 2018. Normal Pap and mammogram history. 2014 negative colonoscopy. History of GDM with normal blood sugars. Hyperthyroid with normal follow-up with endocrinologist on no medication.  Past medical history, past surgical history, family history and social history were all reviewed and documented in the EPIC chart. 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 Father diabetes and heart disease. Mother died of pancreatic cancer.  ROS:  A ROS was performed and pertinent positives and negatives are included.  Exam:  Vitals:   06/04/17 0851  BP: 120/82  Weight: 202 lb (91.6 kg)  Height: 5\' 7"  (1.702 m)   Body mass index is 31.64 kg/m.   General appearance:  Normal Thyroid:  Symmetrical, normal in size, without palpable masses or nodularity. Respiratory  Auscultation:  Clear without wheezing or rhonchi Cardiovascular  Auscultation:  Regular rate, without rubs, murmurs or gallops  Edema/varicosities:  Not grossly evident Abdominal  Soft,nontender, without masses, guarding or rebound.  Liver/spleen:  No organomegaly noted  Hernia:  None appreciated  Skin  Inspection:  Grossly normal   Breasts: Examined lying and sitting.     Right: Without masses, retractions, discharge or axillary adenopathy.     Left: Without masses, retractions, discharge or axillary adenopathy. Gentitourinary   Inguinal/mons:  Normal without inguinal adenopathy  External genitalia:  Normal  BUS/Urethra/Skene's glands:  Normal  Vagina:  Normal  Cervix:  Normal  Uterus:  normal in size, shape and contour.  Midline and mobile  Adnexa/parametria:     Rt: Without masses or tenderness.   Lt: Without masses or tenderness.  Anus and perineum: Normal  Digital rectal exam: Normal sphincter tone without palpated masses or tenderness  Assessment/Plan:   57 y.o. MWF G1 P1 for annual exam with no complaints.  Light monthly cycle on Loestrin with no menopausal symptoms Obesity History of hyperthyroid  Plan: FSH on placebo week, if elevated HRT for one year and then will reevaluate. CBC, CMP, lipid panel, TSH, T4,  Pap normal 2017, new screening guidelines reviewed. SBE's, continue annual screening mammogram, calcium rich diet, vitamin D 2000 daily and decrease calorie/carbs encouraged. Reviewed importance of exercise, weightbearing.    Harrington Challengerancy J Alean Kromer [facility] HospitalWHNP, 9:25 AM 06/04/2017

## 2017-06-05 ENCOUNTER — Other Ambulatory Visit: Payer: Self-pay | Admitting: Women's Health

## 2017-06-05 DIAGNOSIS — R928 Other abnormal and inconclusive findings on diagnostic imaging of breast: Secondary | ICD-10-CM

## 2017-06-05 LAB — TEST AUTHORIZATION

## 2017-06-05 LAB — COMPREHENSIVE METABOLIC PANEL
AG Ratio: 1.4 (calc) (ref 1.0–2.5)
ALBUMIN MSPROF: 4.1 g/dL (ref 3.6–5.1)
ALT: 13 U/L (ref 6–29)
AST: 16 U/L (ref 10–35)
Alkaline phosphatase (APISO): 96 U/L (ref 33–130)
BILIRUBIN TOTAL: 0.5 mg/dL (ref 0.2–1.2)
BUN/Creatinine Ratio: 11 (calc) (ref 6–22)
BUN: 12 mg/dL (ref 7–25)
CO2: 30 mmol/L (ref 20–32)
Calcium: 9.2 mg/dL (ref 8.6–10.4)
Chloride: 104 mmol/L (ref 98–110)
Creat: 1.11 mg/dL — ABNORMAL HIGH (ref 0.50–1.05)
Globulin: 2.9 g/dL (calc) (ref 1.9–3.7)
Glucose, Bld: 110 mg/dL — ABNORMAL HIGH (ref 65–99)
POTASSIUM: 4.4 mmol/L (ref 3.5–5.3)
Sodium: 139 mmol/L (ref 135–146)
Total Protein: 7 g/dL (ref 6.1–8.1)

## 2017-06-05 LAB — CBC WITH DIFFERENTIAL/PLATELET
BASOS ABS: 32 {cells}/uL (ref 0–200)
Basophils Relative: 0.4 %
Eosinophils Absolute: 89 cells/uL (ref 15–500)
Eosinophils Relative: 1.1 %
HCT: 43.8 % (ref 35.0–45.0)
Hemoglobin: 15.5 g/dL (ref 11.7–15.5)
Lymphs Abs: 1806 cells/uL (ref 850–3900)
MCH: 32.6 pg (ref 27.0–33.0)
MCHC: 35.4 g/dL (ref 32.0–36.0)
MCV: 92.2 fL (ref 80.0–100.0)
MPV: 9.9 fL (ref 7.5–12.5)
Monocytes Relative: 6.8 %
Neutro Abs: 5621 cells/uL (ref 1500–7800)
Neutrophils Relative %: 69.4 %
Platelets: 291 10*3/uL (ref 140–400)
RBC: 4.75 10*6/uL (ref 3.80–5.10)
RDW: 11.8 % (ref 11.0–15.0)
Total Lymphocyte: 22.3 %
WBC mixed population: 551 cells/uL (ref 200–950)
WBC: 8.1 10*3/uL (ref 3.8–10.8)

## 2017-06-05 LAB — T4: T4, Total: 10.3 ug/dL (ref 5.1–11.9)

## 2017-06-05 LAB — HEMOGLOBIN A1C
EAG (MMOL/L): 6.3 (calc)
HEMOGLOBIN A1C: 5.6 %{Hb} (ref <5.7)
MEAN PLASMA GLUCOSE: 114 (calc)

## 2017-06-05 LAB — LIPID PANEL
CHOLESTEROL: 253 mg/dL — AB (ref <200)
HDL: 56 mg/dL (ref >50)
LDL CHOLESTEROL (CALC): 171 mg/dL — AB
Non-HDL Cholesterol (Calc): 197 mg/dL (calc) — ABNORMAL HIGH (ref <130)
Total CHOL/HDL Ratio: 4.5 (calc) (ref <5.0)
Triglycerides: 124 mg/dL (ref <150)

## 2017-06-05 LAB — TSH: TSH: 1.87 mIU/L (ref 0.40–4.50)

## 2017-06-05 LAB — FOLLICLE STIMULATING HORMONE: FSH: 33.7 m[IU]/mL

## 2017-06-08 ENCOUNTER — Ambulatory Visit
Admission: RE | Admit: 2017-06-08 | Discharge: 2017-06-08 | Disposition: A | Discharge status: Home / Self Care | Payer: BC Managed Care – PPO | Source: Ambulatory Visit | Attending: Women's Health | Admitting: Women's Health

## 2017-06-08 ENCOUNTER — Encounter: Payer: Self-pay | Admitting: Women's Health

## 2017-06-08 DIAGNOSIS — R928 Other abnormal and inconclusive findings on diagnostic imaging of breast: Secondary | ICD-10-CM

## 2017-08-08 ENCOUNTER — Other Ambulatory Visit: Payer: Self-pay | Admitting: Women's Health

## 2017-08-08 DIAGNOSIS — Z3041 Encounter for surveillance of contraceptive pills: Secondary | ICD-10-CM

## 2018-01-11 ENCOUNTER — Other Ambulatory Visit: Payer: Self-pay | Admitting: Women's Health

## 2018-01-11 DIAGNOSIS — R921 Mammographic calcification found on diagnostic imaging of breast: Secondary | ICD-10-CM

## 2018-01-16 ENCOUNTER — Ambulatory Visit
Admission: RE | Admit: 2018-01-16 | Discharge: 2018-01-16 | Disposition: A | Discharge status: Home / Self Care | Payer: BC Managed Care – PPO | Source: Ambulatory Visit | Attending: Women's Health | Admitting: Women's Health

## 2018-01-16 ENCOUNTER — Other Ambulatory Visit: Payer: Self-pay | Admitting: Women's Health

## 2018-01-16 DIAGNOSIS — R921 Mammographic calcification found on diagnostic imaging of breast: Secondary | ICD-10-CM

## 2018-07-08 ENCOUNTER — Encounter: Payer: BC Managed Care – PPO | Admitting: Women's Health

## 2018-12-31 ENCOUNTER — Other Ambulatory Visit: Payer: Self-pay

## 2018-12-31 DIAGNOSIS — Z20822 Contact with and (suspected) exposure to covid-19: Secondary | ICD-10-CM

## 2019-01-01 LAB — NOVEL CORONAVIRUS, NAA: SARS-CoV-2, NAA: NOT DETECTED

## 2019-02-19 ENCOUNTER — Other Ambulatory Visit: Payer: Self-pay

## 2019-02-19 DIAGNOSIS — Z20822 Contact with and (suspected) exposure to covid-19: Secondary | ICD-10-CM

## 2019-02-22 LAB — NOVEL CORONAVIRUS, NAA: SARS-CoV-2, NAA: DETECTED — AB

## 2019-04-06 IMAGING — MG DIGITAL DIAGNOSTIC UNILATERAL LEFT MAMMOGRAM WITH TOMO AND CAD
6 series · 6 of 14 positions shown · non-contrast
Comparison: Previous exam(s).

CLINICAL DATA: Patient presents for a diagnostic left breast
examination to follow-up microcalcifications.

EXAM:
DIGITAL DIAGNOSTIC UNILATERAL LEFT MAMMOGRAM WITH CAD AND TOMO

[L CC]
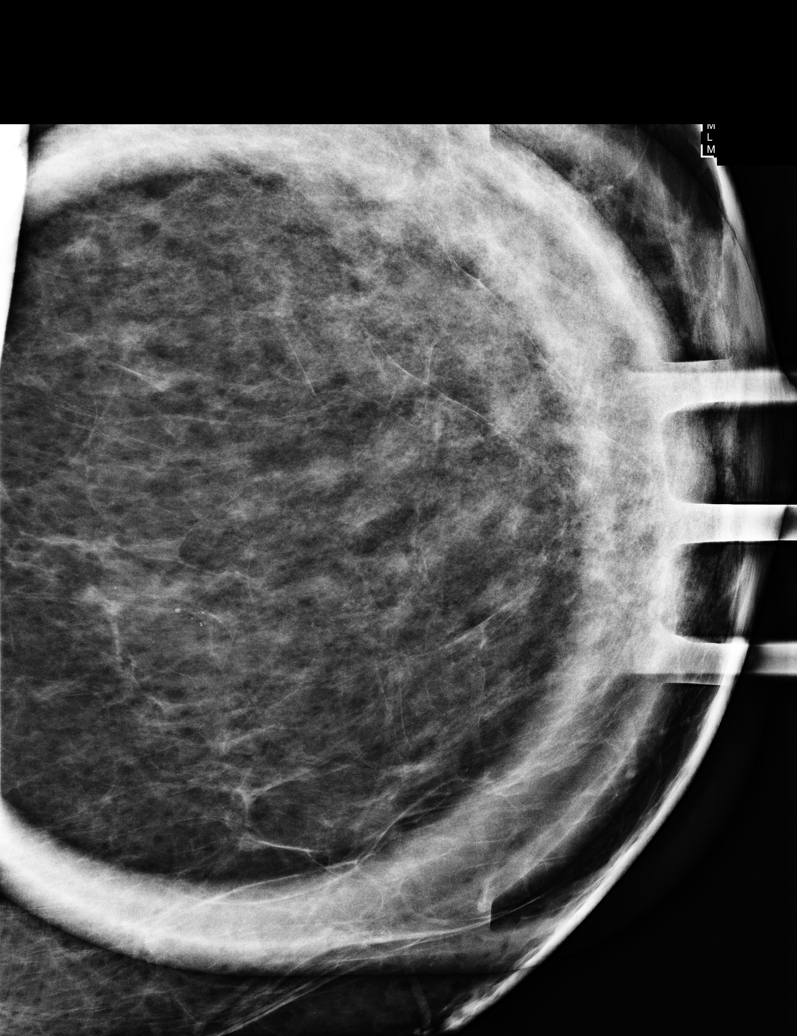

[L ML]
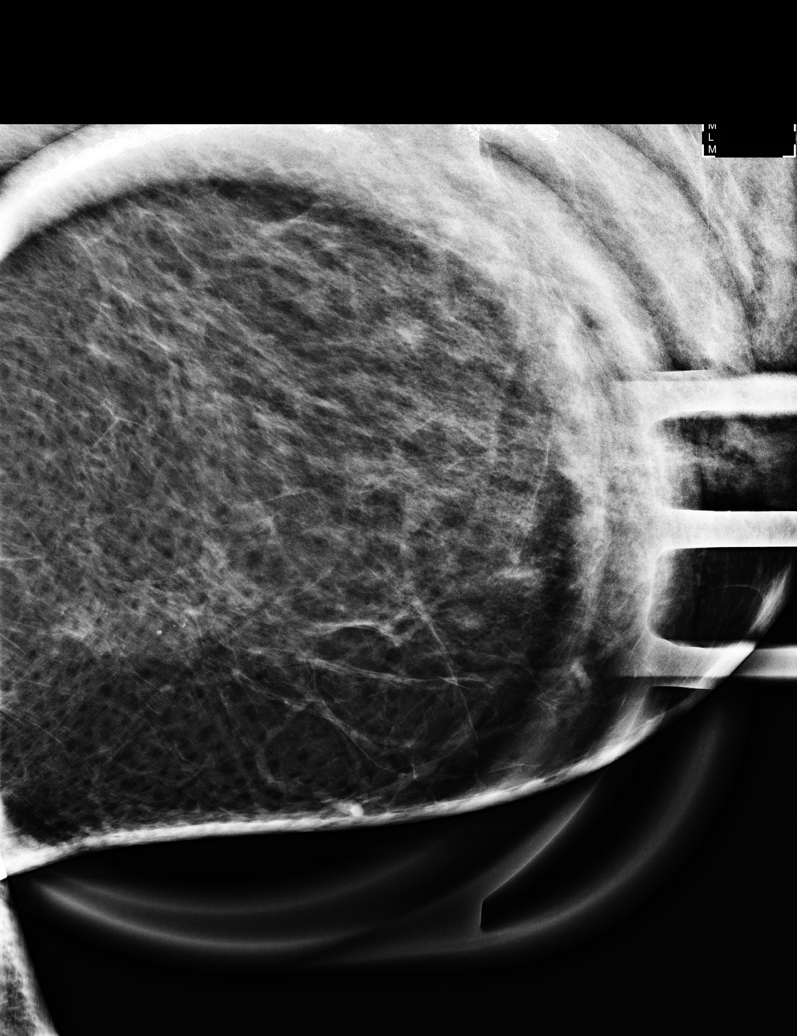

[L MLO synth-2D]
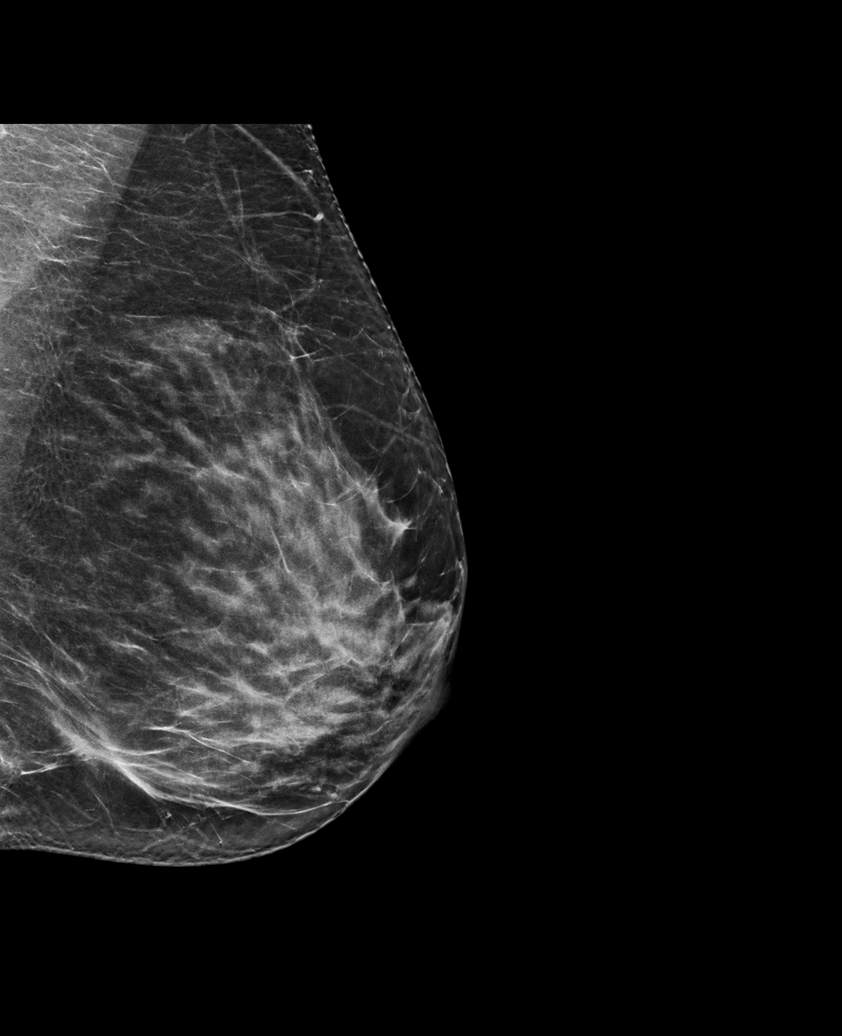

[L CC synth-2D]
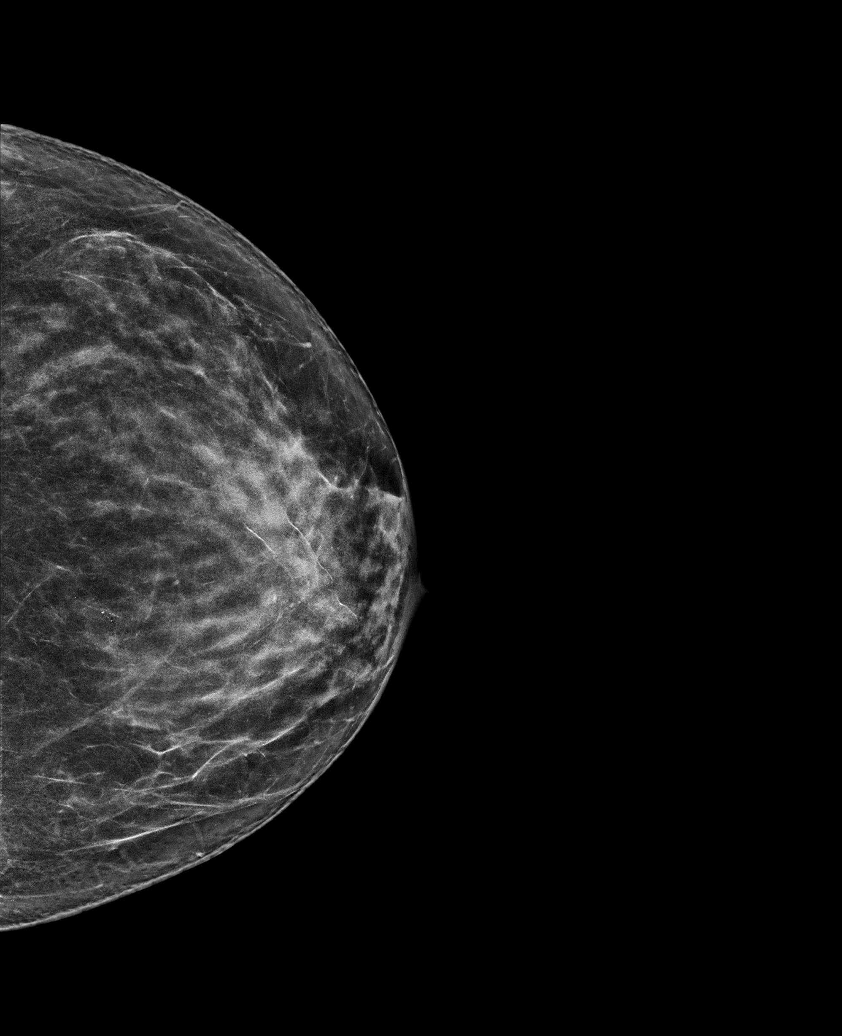

[L MLO tomo · tomo slice 40/79.0]
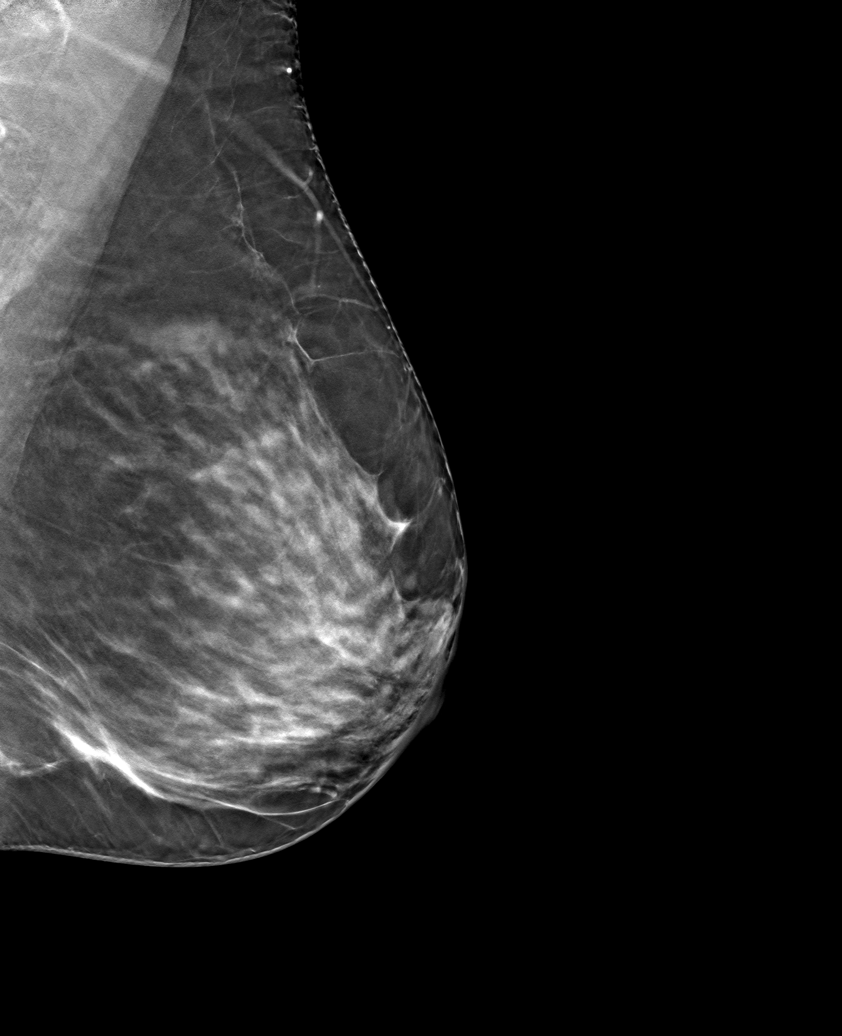

[L CC tomo · tomo slice 38/75.0]
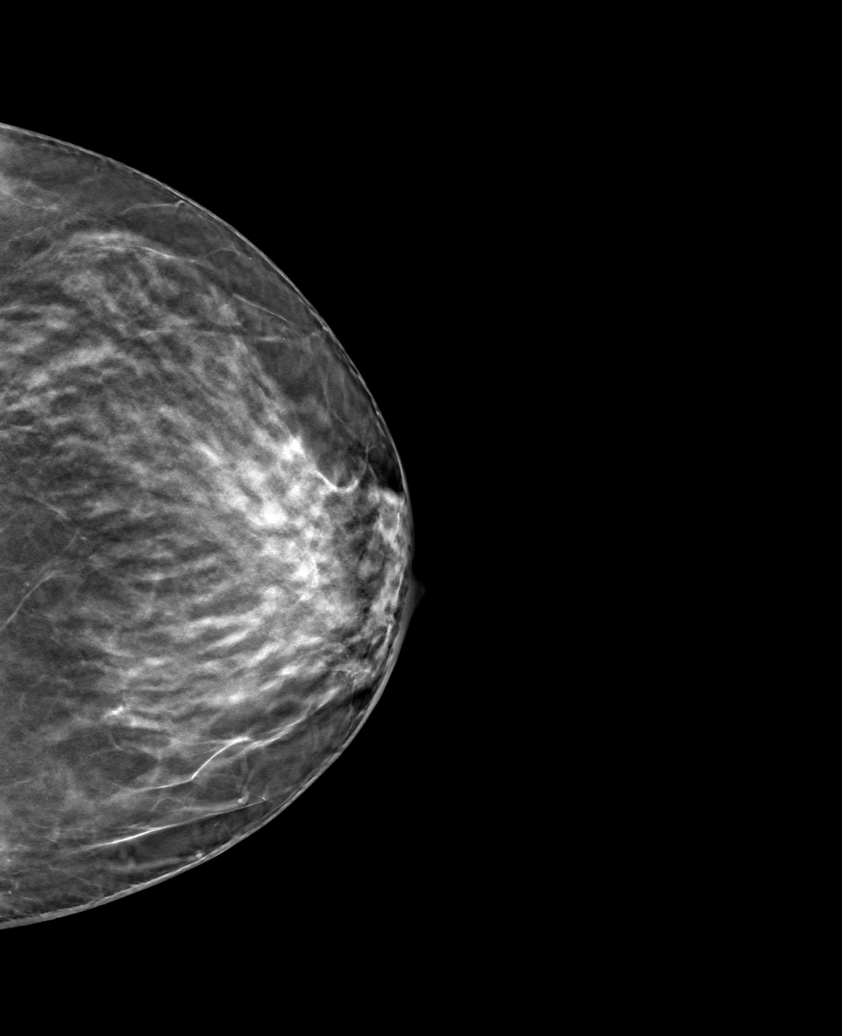

[6 of 14 positions shown; findings below may reference images not displayed]

ACR Breast Density Category c: The breast tissue is heterogeneously
dense, which may obscure small masses.
FINDINGS: Examination demonstrates a stable group of punctate
microcalcifications over the inner lower left breast. Remainder of
the exam is unchanged.

Mammographic images were processed with CAD.
IMPRESSION: Stable probable benign microcalcifications inner lower left breast.

RECOMMENDATION:
Recommend an additional six-month follow-up diagnostic left breast
mammogram with magnification views at the time of patient's annual
bilateral mammogram in June 2018 to document continued stability.

I have discussed the findings and recommendations with the patient.
Results were also provided in writing at the conclusion of the
visit. If applicable, a reminder letter will be sent to the patient
regarding the next appointment.

BI-RADS CATEGORY  3: Probably benign.

## 2019-06-08 ENCOUNTER — Ambulatory Visit: Payer: BC Managed Care – PPO

## 2022-03-12 ENCOUNTER — Telehealth: Payer: BC Managed Care – PPO | Admitting: Physician Assistant

## 2022-03-12 DIAGNOSIS — U071 COVID-19: Secondary | ICD-10-CM

## 2022-03-12 MED ORDER — NAPROXEN 500 MG PO TABS: 500.0000 mg | ORAL_TABLET | Freq: Two times a day (BID) | ORAL | 0 refills | Status: AC

## 2022-03-12 MED ORDER — BENZONATATE 100 MG PO CAPS: 100.0000 mg | ORAL_CAPSULE | Freq: Three times a day (TID) | ORAL | 0 refills | Status: AC | PRN

## 2022-03-12 MED ORDER — FLUTICASONE PROPIONATE 50 MCG/ACT NA SUSP: 2.0000 | Freq: Every day | NASAL | 0 refills | Status: AC

## 2022-03-12 NOTE — Progress Notes (Signed)
E-Visit  for Positive Covid Test Result  We are sorry you are not feeling well. We are here to help!  You have tested positive for COVID-19, meaning that you were infected with the novel coronavirus and could give the virus to others.  It is vitally important that you stay home so you do not spread it to others.      Please continue isolation at home, for at least 10 days since the start of your symptoms and until you have had 24 hours with no fever (without taking a fever reducer) and with improving of symptoms.  If you have no symptoms but tested positive (or all symptoms resolve after 5 days and you have no fever) you can leave your house but continue to wear a mask around others for an additional 5 days. If you have a fever,continue to stay home until you have had 24 hours of no fever. Most cases improve 5-10 days from onset but we have seen a small number of patients who have gotten worse after the 10 days.  Please be sure to watch for worsening symptoms and remain taking the proper precautions.   Go to the nearest hospital ED for assessment if fever/cough/breathlessness are severe or illness seems like a threat to life.    The following symptoms may appear 2-14 days after exposure: Fever Cough Shortness of breath or difficulty breathing Chills Repeated shaking with chills Muscle pain Headache Sore throat New loss of taste or smell Fatigue Congestion or runny nose Nausea or vomiting Diarrhea  You have been enrolled in MyChart Home Monitoring for COVID-19. Daily you will receive a questionnaire within the MyChart website. Our COVID-19 response team will be monitoring your responses daily.  You can use medication such as prescription cough medication called Tessalon Perles 100 mg. You may take 1-2 capsules every 8 hours as needed for cough, prescription anti-inflammatory called Naprosyn 500 mg. Take twice daily as needed for fever or body aches for 2 weeks, and prescription for  Fluticasone nasal spray 2 sprays in each nostril one time per day  You may also take acetaminophen (Tylenol) as needed for fever.  HOME CARE: Only take medications as instructed by your medical team. Drink plenty of fluids and get plenty of rest. A steam or ultrasonic humidifier can help if you have congestion.   GET HELP RIGHT AWAY IF YOU HAVE EMERGENCY WARNING SIGNS.  Call 911 or proceed to your closest emergency facility if: You develop worsening high fever. Trouble breathing Bluish lips or face Persistent pain or pressure in the chest New confusion Inability to wake or stay awake You cough up blood. Your symptoms become more severe Inability to hold down food or fluids  This list is not all possible symptoms. Contact your medical provider for any symptoms that are severe or concerning to you.    Your e-visit answers were reviewed by a board certified advanced clinical practitioner to complete your personal care plan.  Depending on the condition, your plan could have included both over the counter or prescription medications.  If there is a problem please reply once you have received a response from your provider.  Your safety is important to us.  If you have drug allergies check your prescription carefully.    You can use MyChart to ask questions about today's visit, request a non-urgent call back, or ask for a work or school excuse for 24 hours related to this e-Visit. If it has been greater than 24 hours   you will need to follow up with your provider, or enter a new e-Visit to address those concerns. You will get an e-mail in the next two days asking about your experience.  I hope that your e-visit has been valuable and will speed your recovery. Thank you for using e-visits.   I have spent 5 minutes in review of e-visit questionnaire, review and updating patient chart, medical decision making and response to patient.   Coni Homesley M Hershell Brandl, PA-C
